# Patient Record
Sex: Male | Born: 1975 | Race: Black or African American | Hispanic: No | Smoking: Current some day smoker
Health system: Southern US, Community
[De-identification: ages and names within clinical notes are randomized; demographics above are authoritative.]

## PROBLEM LIST (undated history)

## (undated) DIAGNOSIS — E789 Disorder of lipoprotein metabolism, unspecified: Secondary | ICD-10-CM

## (undated) DIAGNOSIS — Z87442 Personal history of urinary calculi: Secondary | ICD-10-CM

## (undated) DIAGNOSIS — S065XAA Traumatic subdural hemorrhage with loss of consciousness status unknown, initial encounter: Secondary | ICD-10-CM

## (undated) DIAGNOSIS — K219 Gastro-esophageal reflux disease without esophagitis: Secondary | ICD-10-CM

## (undated) DIAGNOSIS — G4730 Sleep apnea, unspecified: Secondary | ICD-10-CM

## (undated) DIAGNOSIS — S060XAA Concussion with loss of consciousness status unknown, initial encounter: Secondary | ICD-10-CM

## (undated) HISTORY — DX: Sleep apnea, unspecified: G47.30

## (undated) HISTORY — DX: Concussion with loss of consciousness status unknown, initial encounter: S06.0XAA

## (undated) HISTORY — DX: Gastro-esophageal reflux disease without esophagitis: K21.9

## (undated) HISTORY — DX: Disorder of lipoprotein metabolism, unspecified: E78.9

## (undated) HISTORY — DX: Traumatic subdural hemorrhage with loss of consciousness status unknown, initial encounter: S06.5XAA

---

## 1999-12-28 ENCOUNTER — Emergency Department (HOSPITAL_COMMUNITY): Admission: EM | Admit: 1999-12-28 | Discharge: 1999-12-28 | Payer: Self-pay | Admitting: Emergency Medicine

## 1999-12-30 ENCOUNTER — Emergency Department (HOSPITAL_COMMUNITY): Admission: EM | Admit: 1999-12-30 | Discharge: 1999-12-30 | Payer: Self-pay | Admitting: Emergency Medicine

## 2001-01-23 ENCOUNTER — Emergency Department (HOSPITAL_COMMUNITY): Admission: EM | Admit: 2001-01-23 | Discharge: 2001-01-23 | Payer: Self-pay | Admitting: Emergency Medicine

## 2002-08-17 ENCOUNTER — Emergency Department (HOSPITAL_COMMUNITY): Admission: EM | Admit: 2002-08-17 | Discharge: 2002-08-17 | Payer: Self-pay | Admitting: Emergency Medicine

## 2002-08-17 ENCOUNTER — Encounter: Payer: Self-pay | Admitting: Emergency Medicine

## 2002-09-14 ENCOUNTER — Encounter: Admission: RE | Admit: 2002-09-14 | Discharge: 2002-12-13 | Payer: Self-pay | Admitting: Orthopedic Surgery

## 2002-10-31 ENCOUNTER — Emergency Department (HOSPITAL_COMMUNITY): Admission: EM | Admit: 2002-10-31 | Discharge: 2002-10-31 | Payer: Self-pay

## 2002-11-02 ENCOUNTER — Emergency Department (HOSPITAL_COMMUNITY): Admission: EM | Admit: 2002-11-02 | Discharge: 2002-11-02 | Payer: Self-pay | Admitting: Emergency Medicine

## 2004-11-05 ENCOUNTER — Emergency Department (HOSPITAL_COMMUNITY): Admission: EM | Admit: 2004-11-05 | Discharge: 2004-11-05 | Payer: Self-pay | Admitting: Emergency Medicine

## 2005-06-06 ENCOUNTER — Emergency Department (HOSPITAL_COMMUNITY): Admission: EM | Admit: 2005-06-06 | Discharge: 2005-06-06 | Payer: Self-pay | Admitting: Emergency Medicine

## 2006-05-12 ENCOUNTER — Emergency Department (HOSPITAL_COMMUNITY): Admission: EM | Admit: 2006-05-12 | Discharge: 2006-05-13 | Payer: Self-pay | Admitting: Emergency Medicine

## 2006-06-14 ENCOUNTER — Emergency Department (HOSPITAL_COMMUNITY): Admission: EM | Admit: 2006-06-14 | Discharge: 2006-06-15 | Payer: Self-pay | Admitting: Emergency Medicine

## 2006-06-18 ENCOUNTER — Emergency Department (HOSPITAL_COMMUNITY): Admission: EM | Admit: 2006-06-18 | Discharge: 2006-06-18 | Payer: Self-pay | Admitting: Emergency Medicine

## 2007-01-05 ENCOUNTER — Emergency Department (HOSPITAL_COMMUNITY): Admission: EM | Admit: 2007-01-05 | Discharge: 2007-01-05 | Payer: Self-pay | Admitting: Emergency Medicine

## 2007-10-19 ENCOUNTER — Emergency Department (HOSPITAL_COMMUNITY): Admission: EM | Admit: 2007-10-19 | Discharge: 2007-10-19 | Payer: Self-pay | Admitting: Emergency Medicine

## 2008-06-06 ENCOUNTER — Emergency Department (HOSPITAL_COMMUNITY): Admission: EM | Admit: 2008-06-06 | Discharge: 2008-06-06 | Payer: Self-pay | Admitting: Emergency Medicine

## 2008-09-09 ENCOUNTER — Emergency Department (HOSPITAL_COMMUNITY): Admission: EM | Admit: 2008-09-09 | Discharge: 2008-09-09 | Payer: Self-pay | Admitting: Emergency Medicine

## 2008-10-29 ENCOUNTER — Emergency Department (HOSPITAL_COMMUNITY): Admission: EM | Admit: 2008-10-29 | Discharge: 2008-10-29 | Payer: Self-pay | Admitting: Emergency Medicine

## 2010-05-30 ENCOUNTER — Emergency Department (HOSPITAL_BASED_OUTPATIENT_CLINIC_OR_DEPARTMENT_OTHER)
Admission: EM | Admit: 2010-05-30 | Discharge: 2010-05-30 | Disposition: A | Discharge status: Home / Self Care | Payer: Self-pay | Attending: Emergency Medicine | Admitting: Emergency Medicine

## 2010-05-30 DIAGNOSIS — Z202 Contact with and (suspected) exposure to infections with a predominantly sexual mode of transmission: Secondary | ICD-10-CM | POA: Insufficient documentation

## 2010-05-30 DIAGNOSIS — F17200 Nicotine dependence, unspecified, uncomplicated: Secondary | ICD-10-CM | POA: Insufficient documentation

## 2010-07-20 LAB — RPR: RPR Ser Ql: NONREACTIVE

## 2010-07-20 LAB — GC/CHLAMYDIA PROBE AMP, GENITAL: Chlamydia, DNA Probe: POSITIVE — AB

## 2010-07-29 LAB — RPR: RPR Ser Ql: NONREACTIVE

## 2010-07-29 LAB — URINALYSIS, ROUTINE W REFLEX MICROSCOPIC
Hgb urine dipstick: NEGATIVE
Ketones, ur: NEGATIVE mg/dL
Protein, ur: NEGATIVE mg/dL
Urobilinogen, UA: 1 mg/dL (ref 0.0–1.0)

## 2010-07-29 LAB — GC/CHLAMYDIA PROBE AMP, GENITAL: Chlamydia, DNA Probe: NEGATIVE

## 2011-01-22 LAB — URINALYSIS, ROUTINE W REFLEX MICROSCOPIC
Nitrite: NEGATIVE
Specific Gravity, Urine: 1.019
Urobilinogen, UA: 1

## 2012-10-16 ENCOUNTER — Emergency Department (HOSPITAL_BASED_OUTPATIENT_CLINIC_OR_DEPARTMENT_OTHER)
Admission: EM | Admit: 2012-10-16 | Discharge: 2012-10-16 | Disposition: A | Discharge status: Home / Self Care | Payer: Self-pay | Attending: Emergency Medicine | Admitting: Emergency Medicine

## 2012-10-16 ENCOUNTER — Encounter (HOSPITAL_BASED_OUTPATIENT_CLINIC_OR_DEPARTMENT_OTHER): Payer: Self-pay | Admitting: *Deleted

## 2012-10-16 DIAGNOSIS — N509 Disorder of male genital organs, unspecified: Secondary | ICD-10-CM | POA: Insufficient documentation

## 2012-10-16 DIAGNOSIS — N451 Epididymitis: Secondary | ICD-10-CM

## 2012-10-16 DIAGNOSIS — R599 Enlarged lymph nodes, unspecified: Secondary | ICD-10-CM | POA: Insufficient documentation

## 2012-10-16 DIAGNOSIS — N5089 Other specified disorders of the male genital organs: Secondary | ICD-10-CM | POA: Insufficient documentation

## 2012-10-16 DIAGNOSIS — R509 Fever, unspecified: Secondary | ICD-10-CM | POA: Insufficient documentation

## 2012-10-16 DIAGNOSIS — F17200 Nicotine dependence, unspecified, uncomplicated: Secondary | ICD-10-CM | POA: Insufficient documentation

## 2012-10-16 DIAGNOSIS — N453 Epididymo-orchitis: Secondary | ICD-10-CM | POA: Insufficient documentation

## 2012-10-16 LAB — URINALYSIS, ROUTINE W REFLEX MICROSCOPIC
Hgb urine dipstick: NEGATIVE
Ketones, ur: 15 mg/dL — AB
Protein, ur: 30 mg/dL — AB
Urobilinogen, UA: 2 mg/dL — ABNORMAL HIGH (ref 0.0–1.0)

## 2012-10-16 LAB — URINE MICROSCOPIC-ADD ON

## 2012-10-16 MED ORDER — CEFTRIAXONE SODIUM 1 G IJ SOLR
1.0000 g | Freq: Once | INTRAMUSCULAR | Status: AC
  Administered 2012-10-16: 1 g via INTRAMUSCULAR
  Filled 2012-10-16: qty 10

## 2012-10-16 MED ORDER — DOXYCYCLINE HYCLATE 50 MG PO CAPS: 50.0000 mg | ORAL_CAPSULE | Freq: Two times a day (BID) | ORAL | Status: DC

## 2012-10-16 MED ORDER — HYDROCODONE-ACETAMINOPHEN 5-325 MG PO TABS: 1.0000 | ORAL_TABLET | Freq: Four times a day (QID) | ORAL | Status: DC | PRN

## 2012-10-16 MED ORDER — IBUPROFEN 600 MG PO TABS: 600.0000 mg | ORAL_TABLET | Freq: Four times a day (QID) | ORAL | Status: DC | PRN

## 2012-10-16 NOTE — ED Notes (Signed)
Patient having swelling in his testicles and states that he is having chills. Started Thursday. States lower abd pain. Multiple sexual partners and had trichomonas in the past. Denies burning with urination and denies discharge.

## 2012-10-16 NOTE — ED Notes (Signed)
No reaction noted at injection site.

## 2012-10-16 NOTE — ED Provider Notes (Signed)
I saw and evaluated the patient, reviewed the resident's note and I agree with the findings and plan.   .Face to face Exam:  General:  Awake HEENT:  Atraumatic Resp:  Normal effort Abd:  Nondistended Neuro:No focal weakness   Nelia Shi, MD 10/16/12 1347

## 2012-10-16 NOTE — ED Provider Notes (Signed)
History    CSN: [id number] Arrival date & time 10/16/12  1104  First MD Initiated Contact with Patient 10/16/12 1134     Chief Complaint  Patient presents with  . Groin Swelling   (Consider location/radiation/quality/duration/timing/severity/associated sxs/prior Treatment) HPI Comments: Pt is a 37yo male who presents with 2-3 days of left testicular swelling and pain, gradually worsening, now with fever for ~24 hours. Pt has a history of trichomonas in the past, but no other STI's. Pt's fiance in the room, present by verbal consent from patient. Pt reports multiple sexual partners, most recent new partner about 5-6 days ago. Pt states he started having left testicular swelling about 3 days ago, with worsening pain over the last 48 hours. Pain is suprapubic "right above his penis" and in the testicle itself, and some in the perineal region where his thigh meets his trunk. Mild redness of the overlying scrotum. No pain/swelling/tenderness in the right testicle. No history of GC/Chlamydia to his knowledge.  Otherwise denies symptoms or other medical problems. Pt takes no medications regularly. No chills, N/V, chest pain, SOB, leg swelling/pain. No history of hernia, testicular torsion.  The history is provided by the patient and a significant other (fiance). No language interpreter was used.   History reviewed. No pertinent past medical history. History reviewed. No pertinent past surgical history. No family history on file. History  Substance Use Topics  . Smoking status: Current Every Day Smoker  . Smokeless tobacco: Not on file  . Alcohol Use: No    Review of Systems  Constitutional: Positive for fever. Negative for chills, activity change and appetite change.  HENT: Negative for congestion, sore throat, rhinorrhea, neck pain and neck stiffness.   Respiratory: Negative for shortness of breath and wheezing.   Cardiovascular: Negative for chest pain and leg swelling.   Gastrointestinal: Positive for abdominal pain. Negative for nausea, vomiting, diarrhea, constipation and rectal pain.  Genitourinary: Positive for scrotal swelling and testicular pain. Negative for dysuria, urgency, flank pain, decreased urine volume, discharge and difficulty urinating.  Musculoskeletal: Negative for joint swelling.  Skin: Negative for rash and wound.  Neurological: Negative for syncope, weakness and numbness.    Allergies  Review of patient's allergies indicates no known allergies.  Home Medications   Current Outpatient Rx  Name  Route  Sig  Dispense  Refill  . doxycycline (VIBRAMYCIN) 50 MG capsule   Oral   Take 1 capsule (50 mg total) by mouth 2 (two) times daily.   14 capsule   0   . HYDROcodone-acetaminophen (NORCO) 5-325 MG per tablet   Oral   Take 1 tablet by mouth every 6 (six) hours as needed for pain.   30 tablet   0   . ibuprofen (ADVIL,MOTRIN) 600 MG tablet   Oral   Take 1 tablet (600 mg total) by mouth every 6 (six) hours as needed for pain.   50 tablet   0    BP 138/82  Pulse 92  Temp(Src) 100.8 F (38.2 C) (Oral)  Resp 16  Ht 6' (1.829 m)  Wt 185 lb (83.915 kg)  BMI 25.08 kg/m2  SpO2 100% Physical Exam  Nursing note and vitals reviewed. Constitutional: He is oriented to person, place, and time. He appears well-developed and well-nourished.  HENT:  Head: Normocephalic and atraumatic.  Eyes: Conjunctivae are normal. Pupils are equal, round, and reactive to light.  Neck: Normal range of motion. Neck supple.  Cardiovascular: Normal rate, regular rhythm and normal heart sounds.  No murmur heard. Pulmonary/Chest: Effort normal and breath sounds normal. No respiratory distress. He has no wheezes.  Abdominal: Soft. Bowel sounds are normal. He exhibits no distension. There is tenderness.  Low suprapubic, immediately superior to base of shaft of penis  Genitourinary: Penis normal. Right testis shows no mass, no swelling and no tenderness.  Left testis shows swelling and tenderness. Left testis shows no mass. Circumcised. No penile tenderness.  Left testicle itself is swollen and frankly tender, no masses appreciated but left half of scrotum is enlarged, tender, and edematous  Musculoskeletal: Normal range of motion. He exhibits no edema and no tenderness.  Lymphadenopathy:       Right: Inguinal adenopathy present.       Left: Inguinal adenopathy present.  Neurological: He is alert and oriented to person, place, and time. No cranial nerve deficit.  Skin: Skin is warm and dry. No rash noted. There is erythema.  Mild erythema to left half of scrotum over swollen testicle  Psychiatric: He has a normal mood and affect. His behavior is normal.    ED Course  Procedures (including critical care time)  1205: Exam as above, UA with findings immediately below. Urine culture collected as well as urine GC/Chlamydia probe. No mention of trichomonas on urine microscopy. Consider [place] for torsion, though infection seems more likely, epididymitis vs orchitis.  1315: Dr. Radford Pax discussed with Dr. Wilson Singer (urology). Recommendation for empiric treatment for epididymitis, Rocephin IM x1 here plus doxycycline BID for 7 days, then follow-up with urology if no improvement, as an outpt. Dr. Wilson Singer did not recommend ultrasound.   Labs Reviewed  URINALYSIS, ROUTINE W REFLEX MICROSCOPIC - Abnormal; Notable for the following:    Color, Urine AMBER (*)    APPearance CLOUDY (*)    Specific Gravity, Urine 1.031 (*)    Bilirubin Urine SMALL (*)    Ketones, ur 15 (*)    Protein, ur 30 (*)    Urobilinogen, UA 2.0 (*)    Leukocytes, UA SMALL (*)    All other components within normal limits  URINE MICROSCOPIC-ADD ON - Abnormal; Notable for the following:    Bacteria, UA FEW (*)    All other components within normal limits  URINE CULTURE  CHLAMYDIA TRACHOMATIS, PROBE AMP  NEISSERIA GONORRHOEAE, PROBE AMP   No results found. 1. Epididymitis, left     MDM   37yo male with left testicular swelling/pain/redness consistent with epididymitis. Will give Rocephin x1 here plus doxy for 7 days. F/u with urology as needed as an outpt. Red flags that would prompt immediate return to the ED reviewed.  The above was discussed in its entirety with attending ED physician Dr. Radford Pax.   Bobbye Morton, MD  PGY-2, [facility]  Bobbye Morton, MD 10/16/12 [phone]

## 2012-10-18 LAB — URINE CULTURE
Colony Count: NO GROWTH
Culture: NO GROWTH

## 2012-10-18 LAB — CHLAMYDIA TRACHOMATIS, PROBE AMP: CT Probe RNA: NEGATIVE

## 2013-11-08 ENCOUNTER — Emergency Department (HOSPITAL_COMMUNITY)
Admission: EM | Admit: 2013-11-08 | Discharge: 2013-11-08 | Disposition: A | Discharge status: Home / Self Care | Payer: No Typology Code available for payment source | Attending: Emergency Medicine | Admitting: Emergency Medicine

## 2013-11-08 ENCOUNTER — Emergency Department (HOSPITAL_COMMUNITY): Payer: No Typology Code available for payment source

## 2013-11-08 ENCOUNTER — Encounter (HOSPITAL_COMMUNITY): Payer: Self-pay | Admitting: Emergency Medicine

## 2013-11-08 DIAGNOSIS — S46909A Unspecified injury of unspecified muscle, fascia and tendon at shoulder and upper arm level, unspecified arm, initial encounter: Secondary | ICD-10-CM | POA: Insufficient documentation

## 2013-11-08 DIAGNOSIS — S46812A Strain of other muscles, fascia and tendons at shoulder and upper arm level, left arm, initial encounter: Secondary | ICD-10-CM

## 2013-11-08 DIAGNOSIS — Y9389 Activity, other specified: Secondary | ICD-10-CM | POA: Insufficient documentation

## 2013-11-08 DIAGNOSIS — S4980XA Other specified injuries of shoulder and upper arm, unspecified arm, initial encounter: Secondary | ICD-10-CM | POA: Insufficient documentation

## 2013-11-08 DIAGNOSIS — S199XXA Unspecified injury of neck, initial encounter: Secondary | ICD-10-CM

## 2013-11-08 DIAGNOSIS — S0990XA Unspecified injury of head, initial encounter: Secondary | ICD-10-CM | POA: Insufficient documentation

## 2013-11-08 DIAGNOSIS — Z79899 Other long term (current) drug therapy: Secondary | ICD-10-CM | POA: Insufficient documentation

## 2013-11-08 DIAGNOSIS — S0993XA Unspecified injury of face, initial encounter: Secondary | ICD-10-CM | POA: Insufficient documentation

## 2013-11-08 DIAGNOSIS — S46819A Strain of other muscles, fascia and tendons at shoulder and upper arm level, unspecified arm, initial encounter: Principal | ICD-10-CM

## 2013-11-08 DIAGNOSIS — F17200 Nicotine dependence, unspecified, uncomplicated: Secondary | ICD-10-CM | POA: Insufficient documentation

## 2013-11-08 DIAGNOSIS — Y92410 Unspecified street and highway as the place of occurrence of the external cause: Secondary | ICD-10-CM | POA: Insufficient documentation

## 2013-11-08 DIAGNOSIS — IMO0002 Reserved for concepts with insufficient information to code with codable children: Secondary | ICD-10-CM | POA: Insufficient documentation

## 2013-11-08 DIAGNOSIS — S43499A Other sprain of unspecified shoulder joint, initial encounter: Secondary | ICD-10-CM | POA: Insufficient documentation

## 2013-11-08 DIAGNOSIS — S29012A Strain of muscle and tendon of back wall of thorax, initial encounter: Secondary | ICD-10-CM

## 2013-11-08 MED ORDER — METHOCARBAMOL 500 MG PO TABS: 500.0000 mg | ORAL_TABLET | Freq: Two times a day (BID) | ORAL | Status: DC

## 2013-11-08 MED ORDER — DIAZEPAM 5 MG PO TABS
5.0000 mg | ORAL_TABLET | Freq: Once | ORAL | Status: AC
  Administered 2013-11-08: 5 mg via ORAL
  Filled 2013-11-08: qty 1

## 2013-11-08 MED ORDER — HYDROCODONE-ACETAMINOPHEN 5-325 MG PO TABS
2.0000 | ORAL_TABLET | Freq: Once | ORAL | Status: AC
  Administered 2013-11-08: 2 via ORAL
  Filled 2013-11-08: qty 2

## 2013-11-08 MED ORDER — OXYCODONE-ACETAMINOPHEN 5-325 MG PO TABS
1.0000 | ORAL_TABLET | Freq: Four times a day (QID) | ORAL | Status: DC | PRN
Start: 2013-11-08 — End: 2014-10-08

## 2013-11-08 NOTE — Discharge Instructions (Signed)
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
## 2013-11-10 NOTE — ED Provider Notes (Signed)
Medical screening examination/treatment/procedure(s) were performed by non-physician practitioner and as supervising physician I was immediately available for consultation/collaboration.   EKG Interpretation None       Olita Takeshita, MD 11/10/13 2212 

## 2013-11-11 ENCOUNTER — Emergency Department (HOSPITAL_COMMUNITY)
Admission: EM | Admit: 2013-11-11 | Discharge: 2013-11-11 | Disposition: A | Discharge status: Home / Self Care | Payer: No Typology Code available for payment source | Attending: Emergency Medicine | Admitting: Emergency Medicine

## 2013-11-11 ENCOUNTER — Encounter (HOSPITAL_COMMUNITY): Payer: Self-pay | Admitting: Emergency Medicine

## 2013-11-11 DIAGNOSIS — Z79899 Other long term (current) drug therapy: Secondary | ICD-10-CM | POA: Insufficient documentation

## 2013-11-11 DIAGNOSIS — F17200 Nicotine dependence, unspecified, uncomplicated: Secondary | ICD-10-CM | POA: Insufficient documentation

## 2013-11-11 DIAGNOSIS — M62838 Other muscle spasm: Secondary | ICD-10-CM | POA: Insufficient documentation

## 2013-11-11 DIAGNOSIS — IMO0001 Reserved for inherently not codable concepts without codable children: Secondary | ICD-10-CM | POA: Insufficient documentation

## 2013-11-11 DIAGNOSIS — M549 Dorsalgia, unspecified: Secondary | ICD-10-CM | POA: Insufficient documentation

## 2013-11-11 DIAGNOSIS — M79604 Pain in right leg: Secondary | ICD-10-CM

## 2013-11-11 DIAGNOSIS — M79609 Pain in unspecified limb: Secondary | ICD-10-CM | POA: Insufficient documentation

## 2013-11-11 DIAGNOSIS — M25569 Pain in unspecified knee: Secondary | ICD-10-CM | POA: Insufficient documentation

## 2013-11-11 DIAGNOSIS — M542 Cervicalgia: Secondary | ICD-10-CM | POA: Insufficient documentation

## 2013-11-11 MED ORDER — NAPROXEN 500 MG PO TABS: 500.0000 mg | ORAL_TABLET | Freq: Two times a day (BID) | ORAL | Status: DC | PRN

## 2013-11-11 MED ORDER — METHOCARBAMOL 500 MG PO TABS: 500.0000 mg | ORAL_TABLET | Freq: Three times a day (TID) | ORAL | Status: DC | PRN

## 2013-11-11 MED ORDER — OXYCODONE-ACETAMINOPHEN 5-325 MG PO TABS: 1.0000 | ORAL_TABLET | Freq: Four times a day (QID) | ORAL | Status: DC | PRN
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
## 2013-11-11 NOTE — Discharge Instructions (Signed)
Your pain is related to muscle inflammation and spasm. You will need to use Naprosyn in addition to the Robaxin you've been given already to try to help with inflammation and pain, as well as spasms. Take as directed, and don't drive while taking Robaxin or Percocet since they cause drowsiness. Use heat over the affected areas, 20 minutes at a time, every hour. Call the orthopedic doctor for an appointment in 2-3 weeks, that can be cancelled if your symptoms improve. Expect to be sore for the next 1-2 weeks. Return to the emergency department for any changes or worsening symptoms.   Muscle Cramps and Spasms Muscle cramps and spasms are when muscles tighten by themselves. They usually get better within minutes. Muscle cramps are painful. They are usually stronger and last longer than muscle spasms. Muscle spasms may or may not be painful. They can last a few seconds or much longer. HOME CARE  Drink enough fluid to keep your pee (urine) clear or pale yellow.  Massage, stretch, and relax the muscle.  Use a warm towel, heating pad, or warm shower water on tight muscles.  Place ice on the muscle if it is tender or in pain.  Put ice in a plastic bag.  Place a towel between your skin and the bag.  Leave the ice on for 15-20 minutes, 03-04 times a day.  Only take medicine as told by your doctor. GET HELP RIGHT AWAY IF:  Your cramps or spasms get worse, happen more often, or do not get better with time. MAKE SURE YOU:  Understand these instructions.  Will watch your condition.  Will get help right away if you are not doing well or get worse. Document Released: 03/12/2008 Document Revised: 07/25/2012 Document Reviewed: 03/16/2012 [facility] Patient Information 2015 [place], [place]. This information is not intended to replace advice given to you by your health care provider. Make sure you discuss any questions you have with your health care provider.  Musculoskeletal Pain Musculoskeletal pain  is muscle and boney aches and pains. These pains can occur in any part of the body. Your caregiver may treat you without knowing the cause of the pain. They may treat you if blood or urine tests, X-rays, and other tests were normal.  CAUSES There is often not a definite cause or reason for these pains. These pains may be caused by a type of germ (virus). The discomfort may also come from overuse. Overuse includes working out too hard when your body is not fit. Boney aches also come from weather changes. Bone is sensitive to atmospheric pressure changes. HOME CARE INSTRUCTIONS   Ask when your test results will be ready. Make sure you get your test results.  Only take over-the-counter or prescription medicines for pain, discomfort, or fever as directed by your caregiver. If you were given medications for your condition, do not drive, operate machinery or power tools, or sign legal documents for 24 hours. Do not drink alcohol. Do not take sleeping pills or other medications that may interfere with treatment.  Continue all activities unless the activities cause more pain. When the pain lessens, slowly resume normal activities. Gradually increase the intensity and duration of the activities or exercise.  During periods of severe pain, bed rest may be helpful. Lay or sit in any position that is comfortable.  Putting ice on the injured area.  Put ice in a bag.  Place a towel between your skin and the bag.  Leave the ice on for 15 to  20 minutes, 3 to 4 times a day.  Follow up with your caregiver for continued problems and no reason can be found for the pain. If the pain becomes worse or does not go away, it may be necessary to repeat tests or do additional testing. Your caregiver may need to look further for a possible cause. SEEK IMMEDIATE MEDICAL CARE IF:  You have pain that is getting worse and is not relieved by medications.  You develop chest pain that is associated with shortness or breath,  sweating, feeling sick to your stomach (nauseous), or throw up (vomit).  Your pain becomes localized to the abdomen.  You develop any new symptoms that seem different or that concern you. MAKE SURE YOU:   Understand these instructions.  Will watch your condition.  Will get help right away if you are not doing well or get worse. Document Released: 03/30/2005 Document Revised: 06/22/2011 Document Reviewed: 12/02/2012 [facility] Patient Information 2015 [place], [place]. This information is not intended to replace advice given to you by your health care provider. Make sure you discuss any questions you have with your health care provider.  Heat Therapy Heat therapy can help make painful, stiff muscles and joints feel better. Do not use heat on new injuries. Wait at least 48 hours after an injury to use heat. Do not use heat when you have aches or pains right after an activity. If you still have pain 3 hours after stopping the activity, then you may use heat. HOME CARE Wet heat pack  Soak a clean towel in warm water. Squeeze out the extra water.  Put the warm, wet towel in a plastic bag.  Place a thin, dry towel between your skin and the bag.  Put the heat pack on the area for 5 minutes, and check your skin. Your skin may be pink, but it should not be red.  Leave the heat pack on the area for 15 to 30 minutes.  Repeat this every 2 to 4 hours while awake. Do not use heat while you are sleeping. Warm water bath  Fill a tub with warm water.  Place the affected body part in the tub.  Soak the area for 20 to 40 minutes.  Repeat as needed. Hot water bottle  Fill the water bottle half full with hot water.  Press out the extra air. Close the cap tightly.  Place a dry towel between your skin and the bottle.  Put the bottle on the area for 5 minutes, and check your skin. Your skin may be pink, but it should not be red.  Leave the bottle on the area for 15 to 30 minutes.  Repeat  this every 2 to 4 hours while awake. Electric heating pad  Place a dry towel between your skin and the heating pad.  Set the heating pad on low heat.  Put the heating pad on the area for 10 minutes, and check your skin. Your skin may be pink, but it should not be red.  Leave the heating pad on the area for 20 to 40 minutes.  Repeat this every 2 to 4 hours while awake.  Do not lie on the heating pad.  Do not fall asleep while using the heating pad.  Do not use the heating pad near water. GET HELP RIGHT AWAY IF:  You get blisters or red skin.  Your skin is puffy (swollen), or you lose feeling (numbness) in the affected area.  You have any new problems.  Your problems are getting worse.  You have any questions or concerns. If you have any problems, stop using heat therapy until you see your doctor. MAKE SURE YOU:  Understand these instructions.  Will watch your condition.  Will get help right away if you are not doing well or get worse. Document Released: 06/22/2011 Document Reviewed: 05/23/2013 [facility] Patient Information 2015 [place]. This information is not intended to replace advice given to you by your health care provider. Make sure you discuss any questions you have with your health care provider.
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
## 2013-11-11 NOTE — ED Provider Notes (Signed)
Medical screening examination/treatment/procedure(s) were performed by non-physician practitioner and as supervising physician I was immediately available for consultation/collaboration.   EKG Interpretation None        Rhylen Pulido S Lekia Nier, MD 11/11/13 1539 

## 2014-10-08 ENCOUNTER — Emergency Department (HOSPITAL_BASED_OUTPATIENT_CLINIC_OR_DEPARTMENT_OTHER)
Admission: EM | Admit: 2014-10-08 | Discharge: 2014-10-08 | Disposition: A | Discharge status: Home / Self Care | Payer: Self-pay | Attending: Emergency Medicine | Admitting: Emergency Medicine

## 2014-10-08 ENCOUNTER — Encounter (HOSPITAL_BASED_OUTPATIENT_CLINIC_OR_DEPARTMENT_OTHER): Payer: Self-pay | Admitting: *Deleted

## 2014-10-08 DIAGNOSIS — M25511 Pain in right shoulder: Secondary | ICD-10-CM

## 2014-10-08 DIAGNOSIS — Y92410 Unspecified street and highway as the place of occurrence of the external cause: Secondary | ICD-10-CM | POA: Insufficient documentation

## 2014-10-08 DIAGNOSIS — Z720 Tobacco use: Secondary | ICD-10-CM | POA: Insufficient documentation

## 2014-10-08 DIAGNOSIS — S4991XA Unspecified injury of right shoulder and upper arm, initial encounter: Secondary | ICD-10-CM | POA: Insufficient documentation

## 2014-10-08 DIAGNOSIS — Y998 Other external cause status: Secondary | ICD-10-CM | POA: Insufficient documentation

## 2014-10-08 DIAGNOSIS — S3992XA Unspecified injury of lower back, initial encounter: Secondary | ICD-10-CM | POA: Insufficient documentation

## 2014-10-08 DIAGNOSIS — M545 Low back pain: Secondary | ICD-10-CM

## 2014-10-08 DIAGNOSIS — Y9389 Activity, other specified: Secondary | ICD-10-CM | POA: Insufficient documentation

## 2014-10-08 MED ORDER — CYCLOBENZAPRINE HCL 10 MG PO TABS: 10.0000 mg | ORAL_TABLET | Freq: Three times a day (TID) | ORAL | Status: DC | PRN

## 2014-10-08 NOTE — Discharge Instructions (Signed)
No driving or operating heavy machinery while taking flexeril. This medication may make you drowsy. Take ibuprofen or naproxen as needed for pain. Rest, avoid heavy lifting or hard physical activity for the next 2 days. Apply heat intermittently to your back and ice to your shoulder. Lumbosacral Strain Lumbosacral strain is a strain of any of the parts that make up your lumbosacral vertebrae. Your lumbosacral vertebrae are the bones that make up the lower third of your backbone. Your lumbosacral vertebrae are held together by muscles and tough, fibrous tissue (ligaments).  CAUSES  A sudden blow to your back can cause lumbosacral strain. Also, anything that causes an excessive stretch of the muscles in the low back can cause this strain. This is typically seen when people exert themselves strenuously, fall, lift heavy objects, bend, or crouch repeatedly. RISK FACTORS  Physically demanding work.  Participation in pushing or pulling sports or sports that require a sudden twist of the back (tennis, golf, baseball).  Weight lifting.  Excessive lower back curvature.  Forward-tilted pelvis.  Weak back or abdominal muscles or both.  Tight hamstrings. SIGNS AND SYMPTOMS  Lumbosacral strain may cause pain in the area of your injury or pain that moves (radiates) down your leg.  DIAGNOSIS Your health care provider can often diagnose lumbosacral strain through a physical exam. In some cases, you may need tests such as X-ray exams.  TREATMENT  Treatment for your lower back injury depends on many factors that your clinician will have to evaluate. However, most treatment will include the use of anti-inflammatory medicines. HOME CARE INSTRUCTIONS   Avoid hard physical activities (tennis, racquetball, waterskiing) if you are not in proper physical condition for it. This may aggravate or create problems.  If you have a back problem, avoid sports requiring sudden body movements. Swimming and walking are  generally safer activities.  Maintain good posture.  Maintain a healthy weight.  For acute conditions, you may put ice on the injured area.  Put ice in a plastic bag.  Place a towel between your skin and the bag.  Leave the ice on for 20 minutes, 2-3 times a day.  When the low back starts healing, stretching and strengthening exercises may be recommended. SEEK MEDICAL CARE IF:  Your back pain is getting worse.  You experience severe back pain not relieved with medicines. SEEK IMMEDIATE MEDICAL CARE IF:   You have numbness, tingling, weakness, or problems with the use of your arms or legs.  There is a change in bowel or bladder control.  You have increasing pain in any area of the body, including your belly (abdomen).  You notice shortness of breath, dizziness, or feel faint.  You feel sick to your stomach (nauseous), are throwing up (vomiting), or become sweaty.  You notice discoloration of your toes or legs, or your feet get very cold. MAKE SURE YOU:   Understand these instructions.  Will watch your condition.  Will get help right away if you are not doing well or get worse. Document Released: 01/07/2005 Document Revised: 04/04/2013 Document Reviewed: 11/16/2012 [facility] Patient Information 2015 [place], [place]. This information is not intended to replace advice given to you by your health care provider. Make sure you discuss any questions you have with your health care provider.  Shoulder Pain The shoulder is the joint that connects your arms to your body. The bones that form the shoulder joint include the upper arm bone (humerus), the shoulder blade (scapula), and the collarbone (clavicle). The top of  the humerus is shaped like a ball and fits into a rather flat socket on the scapula (glenoid cavity). A combination of muscles and strong, fibrous tissues that connect muscles to bones (tendons) support your shoulder joint and hold the ball in the socket. Small,  fluid-filled sacs (bursae) are located in different areas of the joint. They act as cushions between the bones and the overlying soft tissues and help reduce friction between the gliding tendons and the bone as you move your arm. Your shoulder joint allows a wide range of motion in your arm. This range of motion allows you to do things like scratch your back or throw a ball. However, this range of motion also makes your shoulder more prone to pain from overuse and injury. Causes of shoulder pain can originate from both injury and overuse and usually can be grouped in the following four categories:  Redness, swelling, and pain (inflammation) of the tendon (tendinitis) or the bursae (bursitis).  Instability, such as a dislocation of the joint.  Inflammation of the joint (arthritis).  Broken bone (fracture). HOME CARE INSTRUCTIONS   Apply ice to the sore area.  Put ice in a plastic bag.  Place a towel between your skin and the bag.  Leave the ice on for 15-20 minutes, 3-4 times per day for the first 2 days, or as directed by your health care provider.  Stop using cold packs if they do not help with the pain.  If you have a shoulder sling or immobilizer, wear it as long as your caregiver instructs. Only remove it to shower or bathe. Move your arm as little as possible, but keep your hand moving to prevent swelling.  Squeeze a soft ball or foam pad as much as possible to help prevent swelling.  Only take over-the-counter or prescription medicines for pain, discomfort, or fever as directed by your caregiver. SEEK MEDICAL CARE IF:   Your shoulder pain increases, or new pain develops in your arm, hand, or fingers.  Your hand or fingers become cold and numb.  Your pain is not relieved with medicines. SEEK IMMEDIATE MEDICAL CARE IF:   Your arm, hand, or fingers are numb or tingling.  Your arm, hand, or fingers are significantly swollen or turn white or blue. MAKE SURE YOU:   Understand  these instructions.  Will watch your condition.  Will get help right away if you are not doing well or get worse. Document Released: 01/07/2005 Document Revised: 08/14/2013 Document Reviewed: 03/14/2011 [facility] Patient Information 2015 [place], [place]. This information is not intended to replace advice given to you by your health care provider. Make sure you discuss any questions you have with your health care provider.  Motor Vehicle Collision It is common to have multiple bruises and sore muscles after a motor vehicle collision (MVC). These tend to feel worse for the first 24 hours. You may have the most stiffness and soreness over the first several hours. You may also feel worse when you wake up the first morning after your collision. After this point, you will usually begin to improve with each day. The speed of improvement often depends on the severity of the collision, the number of injuries, and the location and nature of these injuries. HOME CARE INSTRUCTIONS  Put ice on the injured area.  Put ice in a plastic bag.  Place a towel between your skin and the bag.  Leave the ice on for 15-20 minutes, 3-4 times a day, or as directed by  your health care provider.  Drink enough fluids to keep your urine clear or pale yellow. Do not drink alcohol.  Take a warm shower or bath once or twice a day. This will increase blood flow to sore muscles.  You may return to activities as directed by your caregiver. Be careful when lifting, as this may aggravate neck or back pain.  Only take over-the-counter or prescription medicines for pain, discomfort, or fever as directed by your caregiver. Do not use aspirin. This may increase bruising and bleeding. SEEK IMMEDIATE MEDICAL CARE IF:  You have numbness, tingling, or weakness in the arms or legs.  You develop severe headaches not relieved with medicine.  You have severe neck pain, especially tenderness in the middle of the back of your  neck.  You have changes in bowel or bladder control.  There is increasing pain in any area of the body.  You have shortness of breath, light-headedness, dizziness, or fainting.  You have chest pain.  You feel sick to your stomach (nauseous), throw up (vomit), or sweat.  You have increasing abdominal discomfort.  There is blood in your urine, stool, or vomit.  You have pain in your shoulder (shoulder strap areas).  You feel your symptoms are getting worse. MAKE SURE YOU:  Understand these instructions.  Will watch your condition.  Will get help right away if you are not doing well or get worse. Document Released: 03/30/2005 Document Revised: 08/14/2013 Document Reviewed: 08/27/2010 [facility] Patient Information 2015 [place], [place]. This information is not intended to replace advice given to you by your health care provider. Make sure you discuss any questions you have with your health care provider.

## 2014-10-08 NOTE — ED Notes (Signed)
PA at bedside.
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
## 2016-04-19 ENCOUNTER — Emergency Department (HOSPITAL_COMMUNITY)
Admission: EM | Admit: 2016-04-19 | Discharge: 2016-04-19 | Disposition: A | Discharge status: Home / Self Care | Payer: Self-pay | Attending: Emergency Medicine | Admitting: Emergency Medicine

## 2016-04-19 ENCOUNTER — Encounter (HOSPITAL_COMMUNITY): Payer: Self-pay

## 2016-04-19 DIAGNOSIS — Z79899 Other long term (current) drug therapy: Secondary | ICD-10-CM | POA: Insufficient documentation

## 2016-04-19 DIAGNOSIS — L501 Idiopathic urticaria: Secondary | ICD-10-CM | POA: Insufficient documentation

## 2016-04-19 DIAGNOSIS — F17210 Nicotine dependence, cigarettes, uncomplicated: Secondary | ICD-10-CM | POA: Insufficient documentation

## 2016-04-19 DIAGNOSIS — R21 Rash and other nonspecific skin eruption: Secondary | ICD-10-CM

## 2016-04-19 MED ORDER — CLOTRIMAZOLE-BETAMETHASONE 1-0.05 % EX CREA: 1.0000 "application " | TOPICAL_CREAM | Freq: Two times a day (BID) | CUTANEOUS | 0 refills | Status: DC

## 2016-04-19 NOTE — ED Triage Notes (Signed)
Pt here with hives on back, abdomen and chest.  Started 3 days ago.  No new meds/detergents.

## 2016-04-19 NOTE — Discharge Instructions (Signed)
Use cream as directed to treat your rash, and continue your usual home medications. Get plenty of rest, drink plenty of fluids, shower with cool water (no hot steamy showers); use soap only on your "bits" (underarms and bottom); use cetaphil cleanser or dove soap when using soap; use eucerin or aquaphor lotion after every bath/shower. Please followup with your primary doctor for discussion of your diagnoses and further evaluation after today's visit in 1 week for recheck of symptoms. Return to the ER for changes or worsening symptoms

## 2016-04-19 NOTE — ED Provider Notes (Signed)
[facility]-EMERGENCY DEPT Provider Note   CSN: [id number] Arrival date & time: 04/19/16  1011 By signing my name below, I, Jerry Singh, attest that this documentation has been prepared under the direction and in the presence of non-physician practitioner, Rennie Hack Camprubi-Soms, PA-C. Electronically Signed: Levon HedgerElizabeth Singh, Scribe. 04/19/2016. 11:10 AM.   History   Chief Complaint Chief Complaint  Patient presents with  . Urticaria    HPI Jerry Singh is a 41 y.o. male who presents to the Emergency Department complaining of itching, scattered rash to his upper body and thighs onset 2-3 days ago. Pt used his daughter's Suave body wash before the rash began, but is unsure of when he used this compared to when the rash started. No new lotions, detergents, creams, meds, foods, animals, plants, or any other known triggers; no other affected individuals at home. He has applied generic antifungal (possibly lotrimin but not sure) with no relief. No alleviating or modifying factors noted. He states he does not workout at the gym and does not have a job where he sweats heavily. Pt denies any tongue or lip swelling, trouble swallowing, drooling, wheezing/trouble breathing, fevers, chills, CP, SOB, abdominal pain, N/V/D/C, hematuria, dysuria, myalgias, arthralgias, numbness, tingling, weakness, or any other complaints at this time.    The history is provided by the patient and medical records. No language interpreter was used.  Urticaria  This is a new problem. The current episode started more than 2 days ago. The problem occurs constantly. The problem has not changed since onset.Pertinent negatives include no chest pain, no abdominal pain and no shortness of breath. Nothing aggravates the symptoms. Nothing relieves the symptoms. Treatments tried: OTC fungal cream. The treatment provided no relief.    History reviewed. No pertinent past medical history.  There are no active problems to display for this  patient.   History reviewed. No pertinent surgical history.   Home Medications    Prior to Admission medications   Medication Sig Start Date End Date Taking? Authorizing Provider  cyclobenzaprine (FLEXERIL) 10 MG tablet Take 1 tablet (10 mg total) by mouth every 8 (eight) hours as needed for muscle spasms. 10/08/14   Kathrynn Speedobyn M Hess, PA-C    Family History History reviewed. No pertinent family history.  Social History Social History  Substance Use Topics  . Smoking status: Current Every Day Smoker    Packs/day: 0.50    Types: Cigarettes  . Smokeless tobacco: Never Used  . Alcohol use No     Allergies   Food   Review of Systems Review of Systems  Constitutional: Negative for chills and fever.  HENT: Negative for facial swelling and trouble swallowing.   Respiratory: Negative for shortness of breath.   Cardiovascular: Negative for chest pain.  Gastrointestinal: Negative for abdominal pain, constipation, diarrhea, nausea and vomiting.  Genitourinary: Negative for dysuria and hematuria.  Musculoskeletal: Negative for arthralgias and myalgias.  Skin: Positive for rash.  Allergic/Immunologic: Negative for immunocompromised state.  Neurological: Negative for weakness and numbness.  Psychiatric/Behavioral: Negative for confusion.  10 Systems reviewed and are negative for acute change except as noted in the HPI.   Physical Exam Updated Vital Signs BP 115/89 (BP Location: Right Arm)   Pulse 77   Temp 98.3 F (36.8 C) (Oral)   Resp 18   SpO2 100%   Physical Exam  Constitutional: He is oriented to person, place, and time. Vital signs are normal. He appears well-developed and well-nourished.  Non-toxic appearance. No distress.  Afebrile, nontoxic, NAD  HENT:  Head: Normocephalic and atraumatic.  Mouth/Throat: Mucous membranes are normal.  Eyes: Conjunctivae and EOM are normal. Right eye exhibits no discharge. Left eye exhibits no discharge.  Neck: Normal range of motion.  Neck supple.  Cardiovascular: Normal rate and intact distal pulses.   Pulmonary/Chest: Effort normal. No respiratory distress.  Abdominal: Normal appearance. He exhibits no distension.  Musculoskeletal: Normal range of motion.  Neurological: He is alert and oriented to person, place, and time. He has normal strength. No sensory deficit.  Skin: Skin is warm, dry and intact. Rash noted. Rash is maculopapular and urticarial.  Multiple circular raised patches of rash over the entire upper body and arms, somewhat dry and lichenified in some areas, with other areas appearing to have a circular scale around a central cleared area; rash mildly erythematous, but without warmth, no interdigital web space involvement and no burrowing.   Psychiatric: He has a normal mood and affect.  Nursing note and vitals reviewed.   ED Treatments / Results  DIAGNOSTIC STUDIES:  Oxygen Saturation is 100% on RA, normal by my interpretation.    COORDINATION OF CARE:  11:38 AM Discussed treatment plan with pt at bedside and pt agreed to plan.  Labs (all labs ordered are listed, but only abnormal results are displayed) Labs Reviewed - No data to display  EKG  EKG Interpretation None       Radiology No results found.  Procedures Procedures (including critical care time)  Medications Ordered in ED Medications - No data to display   Initial Impression / Assessment and Plan / ED Course  I have reviewed the triage vital signs and the nursing notes.  Pertinent labs & imaging results that were available during my care of the patient were reviewed by me and considered in my medical decision making (see chart for details).  Clinical Course     41 y.o. male here with rash x3 days, recently changed soap but isn't sure if this caused it. Some areas of rash look like it could be ringworm, but other areas just look like eczema vs psoriasis. Will treat with lotrisone which would help with both. Discussed  avoidance of triggers, and staying moisturized, etc for eczema care. F/up with PCP in 1wk. I explained the diagnosis and have given explicit precautions to return to the ER including for any other new or worsening symptoms. The patient understands and accepts the medical plan as it's been dictated and I have answered their questions. Discharge instructions concerning home care and prescriptions have been given. The patient is STABLE and is discharged to home in good condition.   I personally performed the services described in this documentation, which was scribed in my presence. The recorded information has been reviewed and is accurate.   Final Clinical Impressions(s) / ED Diagnoses   Final diagnoses:  Rash  Idiopathic urticaria    New Prescriptions New Prescriptions   CLOTRIMAZOLE-BETAMETHASONE (LOTRISONE) CREAM    Apply 1 application topically 2 (two) times daily.     Allan Bacigalupi Camprubi-Soms, PA-C 04/19/16 1215    Vanetta Mulders, MD 04/21/16 2121

## 2017-07-17 ENCOUNTER — Encounter (HOSPITAL_BASED_OUTPATIENT_CLINIC_OR_DEPARTMENT_OTHER): Payer: Self-pay | Admitting: Emergency Medicine

## 2017-07-17 ENCOUNTER — Emergency Department (HOSPITAL_BASED_OUTPATIENT_CLINIC_OR_DEPARTMENT_OTHER): Payer: No Typology Code available for payment source

## 2017-07-17 ENCOUNTER — Emergency Department (HOSPITAL_BASED_OUTPATIENT_CLINIC_OR_DEPARTMENT_OTHER)
Admission: EM | Admit: 2017-07-17 | Discharge: 2017-07-17 | Disposition: A | Discharge status: Home / Self Care | Payer: No Typology Code available for payment source | Attending: Emergency Medicine | Admitting: Emergency Medicine

## 2017-07-17 ENCOUNTER — Other Ambulatory Visit: Payer: Self-pay

## 2017-07-17 DIAGNOSIS — M542 Cervicalgia: Secondary | ICD-10-CM | POA: Insufficient documentation

## 2017-07-17 DIAGNOSIS — Y9389 Activity, other specified: Secondary | ICD-10-CM | POA: Insufficient documentation

## 2017-07-17 DIAGNOSIS — M25511 Pain in right shoulder: Secondary | ICD-10-CM | POA: Diagnosis not present

## 2017-07-17 DIAGNOSIS — Z79899 Other long term (current) drug therapy: Secondary | ICD-10-CM | POA: Diagnosis not present

## 2017-07-17 DIAGNOSIS — Y92410 Unspecified street and highway as the place of occurrence of the external cause: Secondary | ICD-10-CM | POA: Insufficient documentation

## 2017-07-17 DIAGNOSIS — M5410 Radiculopathy, site unspecified: Secondary | ICD-10-CM | POA: Insufficient documentation

## 2017-07-17 DIAGNOSIS — Y999 Unspecified external cause status: Secondary | ICD-10-CM | POA: Insufficient documentation

## 2017-07-17 DIAGNOSIS — F17210 Nicotine dependence, cigarettes, uncomplicated: Secondary | ICD-10-CM | POA: Insufficient documentation

## 2017-07-17 MED ORDER — CYCLOBENZAPRINE HCL 10 MG PO TABS: 10.0000 mg | ORAL_TABLET | Freq: Two times a day (BID) | ORAL | 0 refills | Status: DC | PRN

## 2017-07-17 MED ORDER — METHYLPREDNISOLONE 4 MG PO TBPK: ORAL_TABLET | ORAL | 0 refills | Status: DC

## 2017-07-17 MED ORDER — HYDROCODONE-ACETAMINOPHEN 5-325 MG PO TABS: 1.0000 | ORAL_TABLET | ORAL | 0 refills | Status: DC | PRN
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
## 2017-07-17 NOTE — ED Notes (Signed)
ED Provider at bedside. 

## 2017-07-17 NOTE — ED Notes (Signed)
Patient transported to X-ray 

## 2017-07-17 NOTE — Discharge Instructions (Signed)
Your workup today showed no evidence of fracture or dislocation on your spine, chest, or shoulder.  You did have some degenerative disc disease seen on your cervical spine.  As you are symptoms are intermittent both with pain and with the tingling, we did not feel he needed emergent MRI however, your pain does sound radicular and nerve like.  Please use the steroid pack as well as the pain and muscle relaxant to help with your symptoms.  Please follow-up with your primary doctor in several days.  If any symptoms change, worsen, or persist, please return to the nearest emergency department to consider MRI of the neck.

## 2017-07-24 ENCOUNTER — Emergency Department (HOSPITAL_BASED_OUTPATIENT_CLINIC_OR_DEPARTMENT_OTHER)
Admission: EM | Admit: 2017-07-24 | Discharge: 2017-07-24 | Disposition: A | Discharge status: Home / Self Care | Payer: No Typology Code available for payment source | Attending: Emergency Medicine | Admitting: Emergency Medicine

## 2017-07-24 ENCOUNTER — Emergency Department (HOSPITAL_BASED_OUTPATIENT_CLINIC_OR_DEPARTMENT_OTHER): Payer: No Typology Code available for payment source

## 2017-07-24 ENCOUNTER — Encounter (HOSPITAL_BASED_OUTPATIENT_CLINIC_OR_DEPARTMENT_OTHER): Payer: Self-pay | Admitting: Emergency Medicine

## 2017-07-24 ENCOUNTER — Other Ambulatory Visit: Payer: Self-pay

## 2017-07-24 DIAGNOSIS — M5412 Radiculopathy, cervical region: Secondary | ICD-10-CM | POA: Diagnosis not present

## 2017-07-24 DIAGNOSIS — M542 Cervicalgia: Secondary | ICD-10-CM | POA: Diagnosis present

## 2017-07-24 DIAGNOSIS — F17210 Nicotine dependence, cigarettes, uncomplicated: Secondary | ICD-10-CM | POA: Insufficient documentation

## 2017-07-24 MED ORDER — METHOCARBAMOL 500 MG PO TABS: 500.0000 mg | ORAL_TABLET | Freq: Two times a day (BID) | ORAL | 0 refills | Status: DC

## 2017-07-24 MED ORDER — IBUPROFEN 800 MG PO TABS: 800.0000 mg | ORAL_TABLET | Freq: Three times a day (TID) | ORAL | 0 refills | Status: DC | PRN

## 2017-07-24 NOTE — Discharge Instructions (Signed)
You were seen in the ED today with neck pain. Your MRI showed pinching of the nerves coming from your neck. I am changing some of your medications and will have you call the Neurosurgery office listed on Monday to schedule a follow up appointment with them to discuss management options.   Return to the ED with any sudden weakness, numbness, or worsening pain.
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
## 2018-12-20 ENCOUNTER — Other Ambulatory Visit: Payer: Self-pay

## 2018-12-20 DIAGNOSIS — Z20822 Contact with and (suspected) exposure to covid-19: Secondary | ICD-10-CM

## 2018-12-22 LAB — NOVEL CORONAVIRUS, NAA: SARS-CoV-2, NAA: NOT DETECTED

## 2021-04-13 HISTORY — PX: INGUINAL HERNIA REPAIR: SUR1180

## 2021-09-18 ENCOUNTER — Encounter (HOSPITAL_BASED_OUTPATIENT_CLINIC_OR_DEPARTMENT_OTHER): Payer: Self-pay | Admitting: Family Medicine

## 2021-09-18 ENCOUNTER — Ambulatory Visit (INDEPENDENT_AMBULATORY_CARE_PROVIDER_SITE_OTHER): Payer: Managed Care, Other (non HMO)

## 2021-09-18 ENCOUNTER — Ambulatory Visit (INDEPENDENT_AMBULATORY_CARE_PROVIDER_SITE_OTHER): Payer: Managed Care, Other (non HMO) | Admitting: Family Medicine

## 2021-09-18 VITALS — BP 115/83 | HR 68 | Ht 71.0 in | Wt 184.6 lb

## 2021-09-18 DIAGNOSIS — G8929 Other chronic pain: Secondary | ICD-10-CM | POA: Diagnosis not present

## 2021-09-18 DIAGNOSIS — M25561 Pain in right knee: Secondary | ICD-10-CM

## 2021-09-18 DIAGNOSIS — M25511 Pain in right shoulder: Secondary | ICD-10-CM | POA: Diagnosis not present

## 2021-09-18 DIAGNOSIS — M25562 Pain in left knee: Secondary | ICD-10-CM
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
## 2021-09-24 ENCOUNTER — Encounter (HOSPITAL_BASED_OUTPATIENT_CLINIC_OR_DEPARTMENT_OTHER): Payer: Self-pay

## 2021-09-29 DIAGNOSIS — G8929 Other chronic pain: Secondary | ICD-10-CM | POA: Insufficient documentation
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
## 2021-10-22 ENCOUNTER — Encounter (HOSPITAL_BASED_OUTPATIENT_CLINIC_OR_DEPARTMENT_OTHER): Payer: Self-pay | Admitting: Physical Therapy

## 2021-10-22 ENCOUNTER — Ambulatory Visit (HOSPITAL_BASED_OUTPATIENT_CLINIC_OR_DEPARTMENT_OTHER): Payer: Managed Care, Other (non HMO) | Attending: Family Medicine | Admitting: Physical Therapy

## 2021-10-22 DIAGNOSIS — M25511 Pain in right shoulder: Secondary | ICD-10-CM | POA: Insufficient documentation

## 2021-10-22 DIAGNOSIS — G8929 Other chronic pain: Secondary | ICD-10-CM | POA: Diagnosis not present

## 2021-10-22 DIAGNOSIS — M25611 Stiffness of right shoulder, not elsewhere classified: Secondary | ICD-10-CM | POA: Diagnosis not present

## 2021-10-22 DIAGNOSIS — M25561 Pain in right knee: Secondary | ICD-10-CM | POA: Diagnosis not present

## 2021-10-22 DIAGNOSIS — M25562 Pain in left knee: Secondary | ICD-10-CM | POA: Insufficient documentation

## 2021-10-22 DIAGNOSIS — M62838 Other muscle spasm: Secondary | ICD-10-CM | POA: Insufficient documentation

## 2021-10-22 NOTE — Therapy (Signed)
OUTPATIENT PHYSICAL THERAPY SHOULDER EVALUATION   Patient Name: Jerry Singh MRN: [id number] DOB:03/08/1976, 46 y.o., male Today's Date: 10/22/2021   PT End of Session - 10/22/21 1005     Visit Number 1    Number of Visits 12    Date for PT Re-Evaluation 12/03/21    PT Start Time 0857   Patient 13 minutes late   PT Stop Time 0930    PT Time Calculation (min) 33 min    Activity Tolerance Patient tolerated treatment well    Behavior During Therapy [facility] for tasks assessed/performed             History reviewed. No pertinent past medical history. History reviewed. No pertinent surgical history. Patient Active Problem List   Diagnosis Date Noted   Chronic right shoulder pain 09/29/2021   Bilateral chronic knee pain 09/29/2021    PCP: Patient, No Pcp Per  REFERRING PROVIDER: de [place], Raymond J, MD  REFERRING DIAG:  [phone] (ICD-10-CM) - Chronic right shoulder pain  M25.561,M25.562,G89.29 (ICD-10-CM) - Bilateral chronic knee pain    THERAPY DIAG:  Acute pain of right shoulder  Other muscle spasm  Stiffness of right shoulder, not elsewhere classified  Rationale for Evaluation and Treatment Rehabilitation  ONSET DATE: 8 months ago - shoulder  SUBJECTIVE:                                                                                                                                                                                      SUBJECTIVE STATEMENT: Patient reports he was playing basketball 8 months ago and was hit by another player in the R shoulder while shooting. He had an immediate onset of pain on the posterior aspect - he describes it felt like "hitting his funny bone". He has had issues moving his shoulder since - primarily in overhead movements and behind the back. He has been unable to play basketball since. He reports some occasional tingling in the area with overhead movements; denies any sx or changes in sensation in the UE. He also has some  chronic bil knee pain - says it is arthritis but doesn't bother him except after playing golf or basketball for multiple hours. He wants to improve motion and strength in the shoulder to return to full function with hobbies.  PERTINENT HISTORY: No significant PMH.  PAIN:  Are you having pain? Yes: NPRS scale: 8/10 Pain location: Posterior medial to shoulder, over upper trap/supraspinatus Pain description: Ache, soreness Aggravating factors: Overhead motion, behind the back or head, shooting Relieving factors: Rest  PRECAUTIONS: None  WEIGHT BEARING RESTRICTIONS No  FALLS:  Has patient fallen in last 6 months? No  LIVING  ENVIRONMENT: Lives with: lives alone Lives in: House/apartment  OCCUPATION: Retired  PLOF: Independent  PATIENT GOALS    Return to basketball, golf without pain.  OBJECTIVE:   DIAGNOSTIC FINDINGS:  X-ray 09/18/21 IMPRESSION: No acute osseous abnormality identified.    PATIENT SURVEYS:  FOTO 60 74 expected in 10 visits COGNITION:  Overall cognitive status: Within functional limits for tasks assessed     SENSATION: WFL  POSTURE: WNL  UPPER EXTREMITY ROM:   Active ROM Right eval Left eval  Shoulder flexion 125 Full   Shoulder extension    Shoulder abduction 125 Full   Shoulder adduction    Shoulder internal rotation Painful to L2  T-10 on the left   Shoulder external rotation    Elbow flexion    Elbow extension    Wrist flexion    Wrist extension    Wrist ulnar deviation    Wrist radial deviation    Wrist pronation    Wrist supination    (Blank rows = not tested)  UPPER EXTREMITY MMT:  MMT Right eval Left eval  Shoulder flexion 44.1 50.2  Shoulder extension    Shoulder abduction 56 47  Shoulder adduction    Shoulder internal rotation 38.2 39.2  Shoulder external rotation 20.5 painful 36.1  Middle trapezius    Lower trapezius    Elbow flexion    Elbow extension    Wrist flexion    Wrist extension    Wrist ulnar  deviation    Wrist radial deviation    Wrist pronation    Wrist supination    Grip strength (lbs)    (Blank rows = not tested)  SHOULDER SPECIAL TESTS:  Rotator cuff assessment: Drop arm test: negative, Empty can test: negative, Full can test: negative, and Gerber lift off test: negative  JOINT MOBILITY TESTING:  WNL and pain-free with AP and Inf mobs  PALPATION:  Tender to palpation over R supraspinatus/upper trap.   TODAY'S TREATMENT:  Eval  Seated shoulder flexion w/red tb Seated bil ER w/red tb Standing row w/red tb   Education on self-STM   PATIENT EDUCATION: Education details: Therapeutic plan of care, HEP, symptom management Person educated: Patient Education method: Explanation Education comprehension: verbalized understanding   HOME EXERCISE PROGRAM: Access Code: [email or web address] URL: [email or web address] Date: 10/22/2021 Prepared by: Lorayne Bender   ASSESSMENT:  CLINICAL IMPRESSION: Patient is a 46 y.o. M who was seen today for physical therapy evaluation and treatment for R shoulder and bil knee pain. Pt was approx 15 mins late and reports his knees rarely bother him except after long durations of exercise - focused today's evaluation on shoulder pain. He primary limitation is pain and stiffness of posterior periscapular musculature that limits him in overhead and rotational movements. He has been very active and wants to return to basketball and golf without difficulty or worry of pain. He will benefit from skilled therapy to improve range of motion, increase strength, and restore function to return him to prior level with activities.   OBJECTIVE IMPAIRMENTS decreased ROM, decreased strength, increased muscle spasms, and impaired UE functional use.   ACTIVITY LIMITATIONS dressing and reach over head  PARTICIPATION LIMITATIONS: driving and community activity  PERSONAL FACTORS Age, Fitness, and Time since onset of injury/illness/exacerbation are  also affecting patient's functional outcome.   REHAB POTENTIAL: Good  CLINICAL DECISION MAKING: Evolving/moderate complexity  EVALUATION COMPLEXITY: Moderate   GOALS: Goals reviewed with patient? Yes  SHORT TERM GOALS: Target date: 11/12/2021   Patient will demonstrate full  and painless R shoulder flexion AROM. Baseline: Goal status: INITIAL  2.  Patient will decrease pain at worst from 8/10 to 5/10 NPRS to meet MCID. Baseline:  Goal status: INITIAL  3.  Patient will be able to reach behind head and behind back to T11 without pain to demonstrate improved ER/IR ROM. Baseline:  Goal status: INITIAL   LONG TERM GOALS: Target date: 12/03/2021    Patient will be able to shoot basketball with normal form and no pain. Baseline:  Goal status: INITIAL  2.  Patient will be independent with gym and exercise routine to maintain current level of strength and fitness. Baseline:  Goal status: INITIAL  Additional goals for bil knee to be made as indicated following further evaluation.   PLAN: PT FREQUENCY: 2x/week  PT DURATION: 6 weeks  PLANNED INTERVENTIONS: Therapeutic exercises, Therapeutic activity, Neuromuscular re-education, Balance training, Gait training, Patient/Family education, Joint mobilization, Dry Needling, Cryotherapy, Moist heat, Ionotophoresis 4mg /ml Dexamethasone, and Manual therapy  PLAN FOR NEXT SESSION: Shoulder PROM/AAROM/AROM as tolerated to improve ROM. Periscap strength and stability exercises.    , Student-PT  During this treatment session, the therapist was present, participating in and directing the treatment.  Loura Halt, PT 10/22/2021, 3:45 PM

## 2021-10-31 ENCOUNTER — Encounter (HOSPITAL_BASED_OUTPATIENT_CLINIC_OR_DEPARTMENT_OTHER): Payer: Self-pay | Admitting: Physical Therapy

## 2021-10-31 ENCOUNTER — Ambulatory Visit (HOSPITAL_BASED_OUTPATIENT_CLINIC_OR_DEPARTMENT_OTHER): Payer: Managed Care, Other (non HMO) | Admitting: Physical Therapy

## 2021-10-31 DIAGNOSIS — M25511 Pain in right shoulder: Secondary | ICD-10-CM | POA: Diagnosis not present

## 2021-10-31 DIAGNOSIS — M62838 Other muscle spasm: Secondary | ICD-10-CM

## 2021-10-31 DIAGNOSIS — M25611 Stiffness of right shoulder, not elsewhere classified: Secondary | ICD-10-CM

## 2021-10-31 NOTE — Therapy (Signed)
OUTPATIENT PHYSICAL THERAPY SHOULDER EVALUATION   Patient Name: Jerry Singh MRN: [id number] DOB:14-May-1975, 46 y.o., male Today's Date: 10/31/2021     No past medical history on file. No past surgical history on file. Patient Active Problem List   Diagnosis Date Noted   Chronic right shoulder pain 09/29/2021   Bilateral chronic knee pain 09/29/2021    PCP: Patient, No Pcp Per  REFERRING PROVIDER: de [place], Raymond J, MD  REFERRING DIAG:  [phone] (ICD-10-CM) - Chronic right shoulder pain  M25.561,M25.562,G89.29 (ICD-10-CM) - Bilateral chronic knee pain    THERAPY DIAG:  No diagnosis found.  Rationale for Evaluation and Treatment Rehabilitation  ONSET DATE: 8 months ago - shoulder  SUBJECTIVE:                                                                                                                                                                                      SUBJECTIVE STATEMENT: The patient reports it has been abut the same. It dosen't hurt until he reaches overhead.  PERTINENT HISTORY: No significant PMH.  PAIN:  Are you having pain? Yes: NPRS scale: 8/10 Pain location: Posterior medial to shoulder, over upper trap/supraspinatus Pain description: Ache, soreness Aggravating factors: Overhead motion, behind the back or head, shooting Relieving factors: Rest  PRECAUTIONS: None  WEIGHT BEARING RESTRICTIONS No  FALLS:  Has patient fallen in last 6 months? No  LIVING ENVIRONMENT: Lives with: lives alone Lives in: House/apartment  OCCUPATION: Retired  PLOF: Independent  PATIENT GOALS    Return to basketball, golf without pain.  OBJECTIVE:   DIAGNOSTIC FINDINGS:  X-ray 09/18/21 IMPRESSION: No acute osseous abnormality identified.    PATIENT SURVEYS:  FOTO 60 74 expected in 10 visits COGNITION:  Overall cognitive status: Within functional limits for tasks assessed     SENSATION: WFL  POSTURE: WNL  UPPER EXTREMITY ROM:    Active ROM Right eval Left eval  Shoulder flexion 125 Full   Shoulder extension    Shoulder abduction 125 Full   Shoulder adduction    Shoulder internal rotation Painful to L2  T-10 on the left   Shoulder external rotation    Elbow flexion    Elbow extension    Wrist flexion    Wrist extension    Wrist ulnar deviation    Wrist radial deviation    Wrist pronation    Wrist supination    (Blank rows = not tested)  UPPER EXTREMITY MMT:  MMT Right eval Left eval  Shoulder flexion 44.1 50.2  Shoulder extension    Shoulder abduction 56 47  Shoulder adduction    Shoulder internal rotation 38.2 39.2  Shoulder external rotation 20.5  painful 36.1  Middle trapezius    Lower trapezius    Elbow flexion    Elbow extension    Wrist flexion    Wrist extension    Wrist ulnar deviation    Wrist radial deviation    Wrist pronation    Wrist supination    Grip strength (lbs)    (Blank rows = not tested)  SHOULDER SPECIAL TESTS:  Rotator cuff assessment: Drop arm test: negative, Empty can test: negative, Full can test: negative, and Gerber lift off test: negative  JOINT MOBILITY TESTING:  WNL and pain-free with AP and Inf mobs  PALPATION:  Tender to palpation over R supraspinatus/upper trap.   TODAY'S TREATMENT:  7/21 Extension red 2x15  Serratus punches 3lbs 2x15 red  Bilateral ER Red 2x15   Manual: trigger point release to upper trap and levator   Trigger Point Dry-Needling  Treatment instructions: Expect mild to moderate muscle soreness. S/S of pneumothorax if dry needled over a lung field, and to seek immediate medical attention should they occur. Patient verbalized understanding of these instructions and education.  Patient Consent Given: Yes Education handout provided: Yes Muscles treated: upper trap 2 spots using a .30x50 needle  Electrical stimulation performed: No Parameters: N/A Treatment response/outcome: good response to needling      Eval  Seated  shoulder flexion w/red tb Seated bil ER w/red tb Standing row w/red tb   Education on self-STM   PATIENT EDUCATION: Education details: Therapeutic plan of care, HEP, symptom management Person educated: Patient Education method: Explanation Education comprehension: verbalized understanding   HOME EXERCISE PROGRAM: Access Code: [email or web address] URL: [email or web address] Date: 10/22/2021 Prepared by: Lorayne Bender   ASSESSMENT:  CLINICAL IMPRESSION: The patient had a great twitch with needling. Only 2 needles performed because the second one caused several twitches. The patient had a significant trigger point with manual therapy. He tolerated there-ex well. Therapy will continue to progress as tolerated.    OBJECTIVE IMPAIRMENTS decreased ROM, decreased strength, increased muscle spasms, and impaired UE functional use.   ACTIVITY LIMITATIONS dressing and reach over head  PARTICIPATION LIMITATIONS: driving and community activity  PERSONAL FACTORS Age, Fitness, and Time since onset of injury/illness/exacerbation are also affecting patient's functional outcome.   REHAB POTENTIAL: Good  CLINICAL DECISION MAKING: Evolving/moderate complexity  EVALUATION COMPLEXITY: Moderate   GOALS: Goals reviewed with patient? Yes  SHORT TERM GOALS: Target date: 11/12/2021   Patient will demonstrate full and painless R shoulder flexion AROM. Baseline: Goal status: INITIAL  2.  Patient will decrease pain at worst from 8/10 to 5/10 NPRS to meet MCID. Baseline:  Goal status: INITIAL  3.  Patient will be able to reach behind head and behind back to T11 without pain to demonstrate improved ER/IR ROM. Baseline:  Goal status: INITIAL   LONG TERM GOALS: Target date: 12/03/2021    Patient will be able to shoot basketball with normal form and no pain. Baseline:  Goal status: INITIAL  2.  Patient will be independent with gym and exercise routine to maintain current level of strength  and fitness. Baseline:  Goal status: INITIAL  Additional goals for bil knee to be made as indicated following further evaluation.   PLAN: PT FREQUENCY: 2x/week  PT DURATION: 6 weeks  PLANNED INTERVENTIONS: Therapeutic exercises, Therapeutic activity, Neuromuscular re-education, Balance training, Gait training, Patient/Family education, Joint mobilization, Dry Needling, Cryotherapy, Moist heat, Ionotophoresis 4mg /ml Dexamethasone, and Manual therapy  PLAN FOR NEXT SESSION: Shoulder PROM/AAROM/AROM as tolerated to improve ROM. Periscap strength and  stability exercises.    Loura Halt, Student-PT  During this treatment session, the therapist was present, participating in and directing the treatment.  Dessie Coma, PT 10/31/2021, 3:56 PM

## 2021-11-11 ENCOUNTER — Ambulatory Visit (HOSPITAL_BASED_OUTPATIENT_CLINIC_OR_DEPARTMENT_OTHER): Payer: Managed Care, Other (non HMO) | Attending: Family Medicine | Admitting: Physical Therapy

## 2021-11-11 DIAGNOSIS — M25611 Stiffness of right shoulder, not elsewhere classified: Secondary | ICD-10-CM | POA: Insufficient documentation

## 2021-11-11 DIAGNOSIS — M25511 Pain in right shoulder: Secondary | ICD-10-CM | POA: Insufficient documentation

## 2021-11-11 DIAGNOSIS — M62838 Other muscle spasm: Secondary | ICD-10-CM | POA: Diagnosis present

## 2021-11-11 NOTE — Therapy (Addendum)
OUTPATIENT PHYSICAL THERAPY SHOULDER EVALUATION/dischrge    Patient Name: Jerry Singh MRN: [id number] DOB:08/18/1975, 46 y.o., male Today's Date: 11/12/2021   PT End of Session - 11/12/21 0629     Visit Number 3    Number of Visits 12    Date for PT Re-Evaluation 12/03/21    PT Start Time 0930    PT Stop Time 1012    PT Time Calculation (min) 42 min    Activity Tolerance Patient tolerated treatment well    Behavior During Therapy [facility] for tasks assessed/performed              History reviewed. No pertinent past medical history. History reviewed. No pertinent surgical history. Patient Active Problem List   Diagnosis Date Noted   Chronic right shoulder pain 09/29/2021   Bilateral chronic knee pain 09/29/2021    PCP: Patient, No Pcp Per  REFERRING PROVIDER: de [place], Raymond J, MD  REFERRING DIAG:  [phone] (ICD-10-CM) - Chronic right shoulder pain  M25.561,M25.562,G89.29 (ICD-10-CM) - Bilateral chronic knee pain    THERAPY DIAG:  Acute pain of right shoulder  Other muscle spasm  Stiffness of right shoulder, not elsewhere classified  Rationale for Evaluation and Treatment Rehabilitation  ONSET DATE: 8 months ago - shoulder  SUBJECTIVE:                                                                                                                                                                                      SUBJECTIVE STATEMENT: The patient reports it has been abut the same. It dosen't hurt until he reaches overhead.  PERTINENT HISTORY: No significant PMH.  PAIN:  Are you having pain? Yes: NPRS scale: 8/10 Pain location: Posterior medial to shoulder, over upper trap/supraspinatus Pain description: Ache, soreness Aggravating factors: Overhead motion, behind the back or head, shooting Relieving factors: Rest  PRECAUTIONS: None  WEIGHT BEARING RESTRICTIONS No  FALLS:  Has patient fallen in last 6 months? No  LIVING ENVIRONMENT: Lives  with: lives alone Lives in: House/apartment  OCCUPATION: Retired  PLOF: Independent  PATIENT GOALS    Return to basketball, golf without pain.  OBJECTIVE:   DIAGNOSTIC FINDINGS:  X-ray 09/18/21 IMPRESSION: No acute osseous abnormality identified.    PATIENT SURVEYS:  FOTO 60 74 expected in 10 visits COGNITION:  Overall cognitive status: Within functional limits for tasks assessed     SENSATION: WFL  POSTURE: WNL  UPPER EXTREMITY ROM:   Active ROM Right eval Left eval  Shoulder flexion 125 Full   Shoulder extension    Shoulder abduction 125 Full   Shoulder adduction    Shoulder internal rotation Painful to L2  T-10 on  the left   Shoulder external rotation    Elbow flexion    Elbow extension    Wrist flexion    Wrist extension    Wrist ulnar deviation    Wrist radial deviation    Wrist pronation    Wrist supination    (Blank rows = not tested)  UPPER EXTREMITY MMT:  MMT Right eval Left eval  Shoulder flexion 44.1 50.2  Shoulder extension    Shoulder abduction 56 47  Shoulder adduction    Shoulder internal rotation 38.2 39.2  Shoulder external rotation 20.5 painful 36.1  Middle trapezius    Lower trapezius    Elbow flexion    Elbow extension    Wrist flexion    Wrist extension    Wrist ulnar deviation    Wrist radial deviation    Wrist pronation    Wrist supination    Grip strength (lbs)    (Blank rows = not tested)  SHOULDER SPECIAL TESTS:  Rotator cuff assessment: Drop arm test: negative, Empty can test: negative, Full can test: negative, and Gerber lift off test: negative  JOINT MOBILITY TESTING:  WNL and pain-free with AP and Inf mobs  PALPATION:  Tender to palpation over R supraspinatus/upper trap.   TODAY'S TREATMENT:  8/1 Supine ABC 3 lbs 2x  SL ER 2x15 3 lbs   Trigger Point Dry-Needling  Treatment instructions: Expect mild to moderate muscle soreness. S/S of pneumothorax if dry needled over a lung field, and to seek  immediate medical attention should they occur. Patient verbalized understanding of these instructions and education.  Patient Consent Given: Yes Education handout provided: Yes Muscles treated: upper trap 2 spots using a .30x50 needle  Electrical stimulation performed: No Parameters: N/A Treatment response/outcome: good response to needling    T-band green: Ext 2x15 Row 2x15  ER 2x15 IR 2x15   7/21 Extension red 2x15  Serratus punches 3lbs 2x15 red  Bilateral ER Red 2x15   Manual: trigger point release to upper trap and levator   Trigger Point Dry-Needling  Treatment instructions: Expect mild to moderate muscle soreness. S/S of pneumothorax if dry needled over a lung field, and to seek immediate medical attention should they occur. Patient verbalized understanding of these instructions and education.  Patient Consent Given: Yes Education handout provided: Yes Muscles treated: upper trap 2 spots using a .30x50 needle  Electrical stimulation performed: No Parameters: N/A Treatment response/outcome: good response to needling      Eval  Seated shoulder flexion w/red tb Seated bil ER w/red tb Standing row w/red tb   Education on self-STM   PATIENT EDUCATION: Education details: Therapeutic plan of care, HEP, symptom management Person educated: Patient Education method: Explanation Education comprehension: verbalized understanding   HOME EXERCISE PROGRAM: Access Code: [email or web address] URL: [email or web address] Date: 10/22/2021 Prepared by: Carolyne Littles   ASSESSMENT:  CLINICAL IMPRESSION: The patient continues to have a great twitch with dry needling. We needled 3 spots today in his upper trap. All three spots twitched well. We advanced his exercises. He had mild discomfort with his supine ABC's. We advanced his band scapular exercises and added supine ABC and side lying ER to his program. Therapy will continue to progress as tolerated. He is somewhat  frustrated by his lack of progress but he has only had 2 visits.   OBJECTIVE IMPAIRMENTS decreased ROM, decreased strength, increased muscle spasms, and impaired UE functional use.   ACTIVITY LIMITATIONS dressing and reach over head  PARTICIPATION LIMITATIONS: driving and community activity  PERSONAL FACTORS Age, Fitness, and Time since onset of injury/illness/exacerbation are also affecting patient's functional outcome.   REHAB POTENTIAL: Good  CLINICAL DECISION MAKING: Evolving/moderate complexity  EVALUATION COMPLEXITY: Moderate   GOALS: Goals reviewed with patient? Yes  SHORT TERM GOALS: Target date: 11/12/2021   Patient will demonstrate full and painless R shoulder flexion AROM. Baseline: Goal status: INITIAL  2.  Patient will decrease pain at worst from 8/10 to 5/10 NPRS to meet MCID. Baseline:  Goal status: INITIAL  3.  Patient will be able to reach behind head and behind back to T11 without pain to demonstrate improved ER/IR ROM. Baseline:  Goal status: INITIAL   LONG TERM GOALS: Target date: 12/03/2021    Patient will be able to shoot basketball with normal form and no pain. Baseline:  Goal status: INITIAL  2.  Patient will be independent with gym and exercise routine to maintain current level of strength and fitness. Baseline:  Goal status: INITIAL  Additional goals for bil knee to be made as indicated following further evaluation.   PLAN: PT FREQUENCY: 2x/week  PT DURATION: 6 weeks  PLANNED INTERVENTIONS: Therapeutic exercises, Therapeutic activity, Neuromuscular re-education, Balance training, Gait training, Patient/Family education, Joint mobilization, Dry Needling, Cryotherapy, Moist heat, Ionotophoresis 4mg /ml Dexamethasone, and Manual therapy  PLAN FOR NEXT SESSION: Shoulder PROM/AAROM/AROM as tolerated to improve ROM. Periscap strength and stability exercises.  .PHYSICAL THERAPY DISCHARGE SUMMARY  Visits from Start of Care: 5  Current  functional level related to goals / functional outcomes: No improvement in shoulder pain    Remaining deficits: Went back to the MD    Education / Equipment: HEP   Patient agrees to discharge. Patient goals were not met. Patient is being discharged due to lack of progress.   Carney Living, PT 11/12/2021, 10:26 AM

## 2021-11-12 ENCOUNTER — Encounter (HOSPITAL_BASED_OUTPATIENT_CLINIC_OR_DEPARTMENT_OTHER): Payer: Self-pay | Admitting: Physical Therapy

## 2021-11-13 ENCOUNTER — Ambulatory Visit (INDEPENDENT_AMBULATORY_CARE_PROVIDER_SITE_OTHER): Payer: Managed Care, Other (non HMO) | Admitting: Family Medicine

## 2021-11-13 ENCOUNTER — Encounter (HOSPITAL_BASED_OUTPATIENT_CLINIC_OR_DEPARTMENT_OTHER): Payer: Self-pay | Admitting: Family Medicine

## 2021-11-13 VITALS — BP 109/81 | HR 80 | Temp 98.0°F | Ht 71.0 in | Wt 178.6 lb

## 2021-11-13 DIAGNOSIS — M25511 Pain in right shoulder: Secondary | ICD-10-CM | POA: Diagnosis not present

## 2021-11-13 DIAGNOSIS — G8929 Other chronic pain: Secondary | ICD-10-CM

## 2021-11-13 NOTE — Assessment & Plan Note (Addendum)
Patient presents for follow-up of right shoulder pain.  Since last visit he has been working with physical therapy, doing home exercises.  Despite conservative measures, physical therapy, patient continues to have right shoulder pain.  Pain is most notable when getting out of car and using the right arm to push off of the seat.  He also has pain/weakness when he goes to throw a ball such as a golf ball overhand.  Pain primarily along superior aspect of scapula, through trapezius, extending into right shoulder. On exam today, does have some tenderness through periscapular muscles, superior/medial margin of scapula.  Normal passive range of motion, slight reduction with overhead extension.  Empty can negative.  Hawkins with some pain at movement, negative Neer's. Discussed options, given continued symptoms despite conservative measures, physical therapy, we will proceed with MR imaging of the right shoulder for further evaluation.  We will discuss any further treatment recommendations once MRI imaging obtained

## 2021-11-13 NOTE — Progress Notes (Signed)
    Procedures performed today:    None.  Independent interpretation of notes and tests performed by another provider:   None.  Brief History, Exam, Impression, and Recommendations:    BP 109/81   Pulse 80   Temp 98 F (36.7 C)   Ht 5\' 11"  (1.803 m)   Wt 178 lb 9.6 oz (81 kg)   SpO2 100%   BMI 24.91 kg/m   Chronic right shoulder pain Patient presents for follow-up of right shoulder pain.  Since last visit he has been working with physical therapy, doing home exercises.  Despite conservative measures, physical therapy, patient continues to have right shoulder pain.  Pain is most notable when getting out of car and using the right arm to push off of the seat.  He also has pain/weakness when he goes to throw a ball such as a golf ball overhand.  Pain primarily along superior aspect of scapula, through trapezius, extending into right shoulder. On exam today, does have some tenderness through periscapular muscles, superior/medial margin of scapula.  Normal passive range of motion, slight reduction with overhead extension.  Empty can negative.  Hawkins with some pain at movement, negative Neer's. Discussed options, given continued symptoms despite conservative measures, physical therapy, we will proceed with MR imaging of the right shoulder for further evaluation.  We will discuss any further treatment recommendations once MRI imaging obtained   ___________________________________________ Iya Hamed de , MD, ABFM, CAQSM Primary Care and Sports Medicine [facility]

## 2021-11-13 NOTE — Patient Instructions (Signed)
  Medication Instructions:  Your physician recommends that you continue on your current medications as directed. Please refer to the Current Medication list given to you today. --If you need a refill on any your medications before your next appointment, please call your pharmacy first. If no refills are authorized on file call the office.-- Lab Work: Your physician has recommended that you have lab work today: No If you have labs (blood work) drawn today and your tests are completely normal, you will receive your results via MyChart message OR a phone call from our staff.  Please ensure you check your voicemail in the event that you authorized detailed messages to be left on a delegated number. If you have any lab test that is abnormal or we need to change your treatment, we will call you to review the results.  Referrals/Procedures/Imaging: Yes, MRI  Follow-Up: Your next appointment:   Your physician recommends that you schedule a follow-up appointment as needed with Dr. de [place].  You will receive a text message or e-mail with a link to a survey about your care and experience with [place] today! We would greatly appreciate your feedback!   Thanks for letting us be apart of your health journey!!  Primary Care and Sports Medicine   Dr. Ceasar Mons [place]   We encourage you to activate your patient portal called "MyChart".  Sign up information is provided on this After Visit Summary.  MyChart is used to connect with patients for Virtual Visits (Telemedicine).  Patients are able to view lab/test results, encounter notes, upcoming appointments, etc.  Non-urgent messages can be sent to your provider as well. To learn more about what you can do with MyChart, please visit --  [email or web address].

## 2021-11-19 ENCOUNTER — Encounter (HOSPITAL_BASED_OUTPATIENT_CLINIC_OR_DEPARTMENT_OTHER): Payer: Managed Care, Other (non HMO) | Admitting: Physical Therapy

## 2021-11-21 ENCOUNTER — Ambulatory Visit (HOSPITAL_BASED_OUTPATIENT_CLINIC_OR_DEPARTMENT_OTHER): Payer: Managed Care, Other (non HMO) | Admitting: Physical Therapy

## 2021-11-21 DIAGNOSIS — M25511 Pain in right shoulder: Secondary | ICD-10-CM

## 2021-11-21 DIAGNOSIS — M62838 Other muscle spasm: Secondary | ICD-10-CM

## 2021-11-21 DIAGNOSIS — M25611 Stiffness of right shoulder, not elsewhere classified: Secondary | ICD-10-CM

## 2021-11-21 NOTE — Therapy (Signed)
Patient comes in today reporting no change at all in symptoms since the beginning of treatment> he has an MRI next week. We talked to him about holding to see the results of the MRI. He was offered treatment today but agrees holding for the MRI is the best bet. We will follow up on the 23rd

## 2021-11-26 ENCOUNTER — Encounter (HOSPITAL_BASED_OUTPATIENT_CLINIC_OR_DEPARTMENT_OTHER): Payer: Managed Care, Other (non HMO) | Admitting: Physical Therapy

## 2021-11-27 ENCOUNTER — Ambulatory Visit
Admission: RE | Admit: 2021-11-27 | Discharge: 2021-11-27 | Disposition: A | Discharge status: Home / Self Care | Payer: Managed Care, Other (non HMO) | Source: Ambulatory Visit | Attending: Family Medicine | Admitting: Family Medicine

## 2021-11-27 DIAGNOSIS — G8929 Other chronic pain: Secondary | ICD-10-CM

## 2021-11-28 ENCOUNTER — Encounter (HOSPITAL_BASED_OUTPATIENT_CLINIC_OR_DEPARTMENT_OTHER): Payer: Managed Care, Other (non HMO) | Admitting: Physical Therapy

## 2021-12-03 ENCOUNTER — Ambulatory Visit (HOSPITAL_BASED_OUTPATIENT_CLINIC_OR_DEPARTMENT_OTHER): Payer: Managed Care, Other (non HMO) | Admitting: Physical Therapy

## 2021-12-17 ENCOUNTER — Ambulatory Visit (INDEPENDENT_AMBULATORY_CARE_PROVIDER_SITE_OTHER): Payer: Managed Care, Other (non HMO) | Admitting: Family Medicine

## 2021-12-17 ENCOUNTER — Encounter (HOSPITAL_BASED_OUTPATIENT_CLINIC_OR_DEPARTMENT_OTHER): Payer: Self-pay | Admitting: Family Medicine

## 2021-12-17 VITALS — BP 118/78 | HR 64 | Ht 71.0 in | Wt 185.0 lb

## 2021-12-17 DIAGNOSIS — M25511 Pain in right shoulder: Secondary | ICD-10-CM

## 2021-12-17 DIAGNOSIS — G8929 Other chronic pain: Secondary | ICD-10-CM

## 2021-12-17 NOTE — Assessment & Plan Note (Signed)
Patient presenting for follow-up of right shoulder pain.  Since last visit, he did have MRI completed of the right shoulder.  He continues to have some pain as well as restriction in range of motion.  He has been working with physical therapy, doing home exercises as per PT.  Generally symptoms feel about the same from prior to initiating PT. 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  Despite osteoarthritis observed at [facility] joint, not currently having any symptoms related to this.  If possible that tendinopathy of supraspinatus is contributing to some of his symptoms, exam today is generally reassuring in regards to potential rotator cuff etiology and so do not feel that it is specifically active at this time. Discussed imaging findings and treatment options given continued restriction in range of motion and pain.  Suspect possibly having some underlying adhesive capsulitis contributing current symptoms given restricted range of motion, continued pain, largely normal imaging findings.  Discussed option of ultrasound-guided glenohumeral joint injection to try to aid in additional relief and allow for more progress when working with physical therapy and doing home exercises.  Discussed potential risks as well as benefits.  After discussion, patient interested in proceeding with procedure, will arrange for this to be done in the near future We will plan for patient to also continue with physical therapy, particularly after completing injection, would plan for continued aggressive conservative management

## 2021-12-17 NOTE — Progress Notes (Signed)
    Procedures performed today:    None.  Independent interpretation of notes and tests performed by another provider:   None.  Brief History, Exam, Impression, and Recommendations:    BP 118/78   Pulse 64   Ht 5\' 11"  (1.803 m)   Wt 185 lb (83.9 kg)   SpO2 99%   BMI 25.80 kg/m   Chronic right shoulder pain Patient presenting for follow-up of right shoulder pain.  Since last visit, he did have MRI completed of the right shoulder.  He continues to have some pain as well as restriction in range of motion.  He has been working with physical therapy, doing home exercises as per PT.  Generally symptoms feel about the same from prior to initiating PT. 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  Despite osteoarthritis observed at [facility] joint, not currently having any symptoms related to this.  If possible that tendinopathy of supraspinatus is contributing to some of his symptoms, exam today is generally reassuring in regards to potential rotator cuff etiology and so do not feel that it is specifically active at this time. Discussed imaging findings and treatment options given continued restriction in range of motion and pain.  Suspect possibly having some underlying adhesive capsulitis contributing current symptoms given restricted range of motion, continued pain, largely normal imaging findings.  Discussed option of ultrasound-guided glenohumeral joint injection to try to aid in additional relief and allow for more progress when working with physical therapy and doing home exercises.  Discussed potential risks as well as benefits.  After discussion, patient interested in proceeding with procedure, will arrange for this to be done in the near future We will plan for patient to also continue with physical therapy, particularly after completing injection, would plan for continued aggressive conservative  management   ___________________________________________ Linsey Arteaga de [facility], MD, ABFM, CAQSM Primary Care and Sports Medicine [facility]

## 2021-12-17 NOTE — Patient Instructions (Signed)
  Medication Instructions:  Your physician recommends that you continue on your current medications as directed. Please refer to the Current Medication list given to you today. --If you need a refill on any your medications before your next appointment, please call your pharmacy first. If no refills are authorized on file call the office.-- Lab Work: Your physician has recommended that you have lab work today: No If you have labs (blood work) drawn today and your tests are completely normal, you will receive your results via MyChart message OR a phone call from our staff.  Please ensure you check your voicemail in the event that you authorized detailed messages to be left on a delegated number. If you have any lab test that is abnormal or we need to change your treatment, we will call you to review the results.  Referrals/Procedures/Imaging: No  Follow-Up: Your next appointment:   Your physician recommends that you schedule a follow-up appointment in: 1 week with Dr. de Cuba.  You will receive a text message or e-mail with a link to a survey about your care and experience with us today! We would greatly appreciate your feedback!   Thanks for letting us be apart of your health journey!!  Primary Care and Sports Medicine   Dr. Raymond de Cuba   We encourage you to activate your patient portal called "MyChart".  Sign up information is provided on this After Visit Summary.  MyChart is used to connect with patients for Virtual Visits (Telemedicine).  Patients are able to view lab/test results, encounter notes, upcoming appointments, etc.  Non-urgent messages can be sent to your provider as well. To learn more about what you can do with MyChart, please visit --  [email or web address].    

## 2021-12-20 ENCOUNTER — Encounter (HOSPITAL_COMMUNITY): Payer: Self-pay | Admitting: *Deleted

## 2021-12-20 ENCOUNTER — Emergency Department (HOSPITAL_COMMUNITY): Payer: Managed Care, Other (non HMO)

## 2021-12-20 ENCOUNTER — Other Ambulatory Visit: Payer: Self-pay

## 2021-12-20 ENCOUNTER — Inpatient Hospital Stay (HOSPITAL_COMMUNITY)
Admission: EM | Admit: 2021-12-20 | Discharge: 2021-12-22 | DRG: 083 | Disposition: A | Discharge status: Home / Self Care | Payer: Managed Care, Other (non HMO) | Attending: Internal Medicine | Admitting: Internal Medicine

## 2021-12-20 DIAGNOSIS — S0101XA Laceration without foreign body of scalp, initial encounter: Secondary | ICD-10-CM | POA: Diagnosis present

## 2021-12-20 DIAGNOSIS — F17210 Nicotine dependence, cigarettes, uncomplicated: Secondary | ICD-10-CM | POA: Diagnosis present

## 2021-12-20 DIAGNOSIS — S7010XA Contusion of unspecified thigh, initial encounter: Secondary | ICD-10-CM

## 2021-12-20 DIAGNOSIS — Z79899 Other long term (current) drug therapy: Secondary | ICD-10-CM | POA: Diagnosis not present

## 2021-12-20 DIAGNOSIS — S7012XA Contusion of left thigh, initial encounter: Secondary | ICD-10-CM | POA: Diagnosis present

## 2021-12-20 DIAGNOSIS — S70212A Abrasion, left hip, initial encounter: Secondary | ICD-10-CM | POA: Diagnosis present

## 2021-12-20 DIAGNOSIS — Z23 Encounter for immunization: Secondary | ICD-10-CM

## 2021-12-20 DIAGNOSIS — Z91013 Allergy to seafood: Secondary | ICD-10-CM

## 2021-12-20 DIAGNOSIS — S3991XA Unspecified injury of abdomen, initial encounter: Secondary | ICD-10-CM | POA: Diagnosis present

## 2021-12-20 DIAGNOSIS — S065XAA Traumatic subdural hemorrhage with loss of consciousness status unknown, initial encounter: Principal | ICD-10-CM | POA: Diagnosis present

## 2021-12-20 DIAGNOSIS — N179 Acute kidney failure, unspecified: Secondary | ICD-10-CM | POA: Diagnosis present

## 2021-12-20 DIAGNOSIS — Z91018 Allergy to other foods: Secondary | ICD-10-CM | POA: Diagnosis not present

## 2021-12-20 DIAGNOSIS — Z720 Tobacco use: Secondary | ICD-10-CM | POA: Diagnosis not present

## 2021-12-20 DIAGNOSIS — Z20822 Contact with and (suspected) exposure to covid-19: Secondary | ICD-10-CM | POA: Diagnosis present

## 2021-12-20 DIAGNOSIS — S82001A Unspecified fracture of right patella, initial encounter for closed fracture: Secondary | ICD-10-CM | POA: Diagnosis present

## 2021-12-20 DIAGNOSIS — S82001S Unspecified fracture of right patella, sequela: Secondary | ICD-10-CM | POA: Diagnosis not present

## 2021-12-20 LAB — COMPREHENSIVE METABOLIC PANEL
ALT: 20 U/L (ref 0–44)
AST: 33 U/L (ref 15–41)
Albumin: 4 g/dL (ref 3.5–5.0)
Alkaline Phosphatase: 46 U/L (ref 38–126)
Anion gap: 10 (ref 5–15)
BUN: 14 mg/dL (ref 6–20)
CO2: 25 mmol/L (ref 22–32)
Calcium: 9.1 mg/dL (ref 8.9–10.3)
Chloride: 107 mmol/L (ref 98–111)
Creatinine, Ser: 1.36 mg/dL — ABNORMAL HIGH (ref 0.61–1.24)
GFR, Estimated: >60 mL/min (ref >60)
Glucose, Bld: 128 mg/dL — ABNORMAL HIGH (ref 70–99)
Potassium: 3.7 mmol/L (ref 3.5–5.1)
Sodium: 142 mmol/L (ref 135–145)
Total Bilirubin: 0.5 mg/dL (ref 0.3–1.2)
Total Protein: 7.3 g/dL (ref 6.5–8.1)

## 2021-12-20 LAB — I-STAT CHEM 8, ED
BUN: 13 mg/dL (ref 6–20)
Calcium, Ion: 0.91 mmol/L — ABNORMAL LOW (ref 1.15–1.40)
Chloride: 105 mmol/L (ref 98–111)
Creatinine, Ser: 1.3 mg/dL — ABNORMAL HIGH (ref 0.61–1.24)
Glucose, Bld: 125 mg/dL — ABNORMAL HIGH (ref 70–99)
HCT: 39 % (ref 39.0–52.0)
Hemoglobin: 13.3 g/dL (ref 13.0–17.0)
Potassium: 3.7 mmol/L (ref 3.5–5.1)
Sodium: 144 mmol/L (ref 135–145)
TCO2: 24 mmol/L (ref 22–32)

## 2021-12-20 LAB — URINALYSIS, ROUTINE W REFLEX MICROSCOPIC
Bacteria, UA: NONE SEEN
Bilirubin Urine: NEGATIVE
Glucose, UA: NEGATIVE mg/dL
Ketones, ur: NEGATIVE mg/dL
Leukocytes,Ua: NEGATIVE
Nitrite: NEGATIVE
Protein, ur: NEGATIVE mg/dL
RBC / HPF: >50 RBC/hpf — ABNORMAL HIGH (ref 0–5)
Specific Gravity, Urine: 1.043 — ABNORMAL HIGH (ref 1.005–1.030)
pH: 9 — ABNORMAL HIGH (ref 5.0–8.0)

## 2021-12-20 LAB — CBC
HCT: 36.7 % — ABNORMAL LOW (ref 39.0–52.0)
Hemoglobin: 12.2 g/dL — ABNORMAL LOW (ref 13.0–17.0)
MCH: 30.1 pg (ref 26.0–34.0)
MCHC: 33.2 g/dL (ref 30.0–36.0)
MCV: 90.6 fL (ref 80.0–100.0)
Platelets: 210 10*3/uL (ref 150–400)
RBC: 4.05 MIL/uL — ABNORMAL LOW (ref 4.22–5.81)
RDW: 14.7 % (ref 11.5–15.5)
WBC: 16 10*3/uL — ABNORMAL HIGH (ref 4.0–10.5)
nRBC: 0 % (ref 0.0–0.2)

## 2021-12-20 LAB — RESP PANEL BY RT-PCR (FLU A&B, COVID) ARPGX2
Influenza A by PCR: NEGATIVE
Influenza B by PCR: NEGATIVE
SARS Coronavirus 2 by RT PCR: NEGATIVE

## 2021-12-20 LAB — ETHANOL: Alcohol, Ethyl (B): <10 mg/dL (ref <10)

## 2021-12-20 LAB — LACTIC ACID, PLASMA: Lactic Acid, Venous: 1.8 mmol/L (ref 0.5–1.9)

## 2021-12-20 LAB — SAMPLE TO BLOOD BANK

## 2021-12-20 LAB — PROTIME-INR
INR: 1.1 (ref 0.8–1.2)
Prothrombin Time: 14.5 seconds (ref 11.4–15.2)

## 2021-12-20 MED ORDER — TETANUS-DIPHTH-ACELL PERTUSSIS 5-2.5-18.5 LF-MCG/0.5 IM SUSY
0.5000 mL | PREFILLED_SYRINGE | Freq: Once | INTRAMUSCULAR | Status: AC
  Administered 2021-12-20: 0.5 mL via INTRAMUSCULAR

## 2021-12-20 MED ORDER — CEFAZOLIN SODIUM-DEXTROSE 2-4 GM/100ML-% IV SOLN
2.0000 g | Freq: Once | INTRAVENOUS | Status: AC
  Administered 2021-12-20: 2 g via INTRAVENOUS

## 2021-12-20 MED ORDER — HYDROMORPHONE HCL 1 MG/ML IJ SOLN
1.0000 mg | Freq: Once | INTRAMUSCULAR | Status: AC
  Administered 2021-12-20: 1 mg via INTRAVENOUS
  Filled 2021-12-20: qty 1

## 2021-12-20 MED ORDER — LEVETIRACETAM IN NACL 500 MG/100ML IV SOLN
500.0000 mg | Freq: Two times a day (BID) | INTRAVENOUS | Status: DC
Start: 2021-12-20 — End: 2021-12-20

## 2021-12-20 MED ORDER — LEVETIRACETAM IN NACL 500 MG/100ML IV SOLN
500.0000 mg | Freq: Two times a day (BID) | INTRAVENOUS | Status: DC
  Administered 2021-12-20 – 2021-12-22 (×4): 500 mg via INTRAVENOUS
  Filled 2021-12-20 (×5): qty 100

## 2021-12-20 MED ORDER — ONDANSETRON HCL 4 MG/2ML IJ SOLN
4.0000 mg | Freq: Once | INTRAMUSCULAR | Status: AC
  Administered 2021-12-20: 4 mg via INTRAVENOUS
  Filled 2021-12-20: qty 2

## 2021-12-20 MED ORDER — SODIUM CHLORIDE 0.9 % IV BOLUS
1000.0000 mL | Freq: Once | INTRAVENOUS | Status: AC
  Administered 2021-12-20: 1000 mL via INTRAVENOUS

## 2021-12-20 MED ORDER — IOHEXOL 350 MG/ML SOLN
75.0000 mL | Freq: Once | INTRAVENOUS | Status: AC | PRN
  Administered 2021-12-20: 75 mL via INTRAVENOUS
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
## 2021-12-20 NOTE — ED Notes (Signed)
Tp c-t and back asking for many blankets
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
## 2021-12-20 NOTE — ED Notes (Signed)
Medical admitting at  the bedside
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
## 2021-12-20 NOTE — ED Notes (Signed)
The pt keeps both his eyes closed he is complaining of being cold yet hes perspiring under the covers  he will answer questions with his eyes closed  pupils are difficult to visualize  the pt keeps trying to keep his eyelids shut   when there are opened he rolls both his eyes up into his head  I think that his pupils are equal and reactive approx size 2.0

## 2021-12-20 NOTE — ED Notes (Signed)
Dr Wallace Cullens has called the neuro doctor on call   dr Virl Son

## 2021-12-20 NOTE — ED Notes (Signed)
Unable to start documentation no help  computer in room not working  unable to store vitals  entering them manually   just now back timing  vitals etc
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
## 2021-12-21 ENCOUNTER — Inpatient Hospital Stay (HOSPITAL_COMMUNITY): Payer: Managed Care, Other (non HMO)

## 2021-12-21 DIAGNOSIS — Z720 Tobacco use: Secondary | ICD-10-CM

## 2021-12-21 DIAGNOSIS — S065XAA Traumatic subdural hemorrhage with loss of consciousness status unknown, initial encounter: Secondary | ICD-10-CM | POA: Diagnosis not present

## 2021-12-21 LAB — COMPREHENSIVE METABOLIC PANEL
ALT: 21 U/L (ref 0–44)
AST: 42 U/L — ABNORMAL HIGH (ref 15–41)
Albumin: 3.8 g/dL (ref 3.5–5.0)
Alkaline Phosphatase: 43 U/L (ref 38–126)
Anion gap: 8 (ref 5–15)
BUN: 10 mg/dL (ref 6–20)
CO2: 24 mmol/L (ref 22–32)
Calcium: 9 mg/dL (ref 8.9–10.3)
Chloride: 108 mmol/L (ref 98–111)
Creatinine, Ser: 1.22 mg/dL (ref 0.61–1.24)
GFR, Estimated: >60 mL/min (ref >60)
Glucose, Bld: 145 mg/dL — ABNORMAL HIGH (ref 70–99)
Potassium: 4.2 mmol/L (ref 3.5–5.1)
Sodium: 140 mmol/L (ref 135–145)
Total Bilirubin: 0.6 mg/dL (ref 0.3–1.2)
Total Protein: 6.9 g/dL (ref 6.5–8.1)

## 2021-12-21 LAB — CBC
HCT: 36.9 % — ABNORMAL LOW (ref 39.0–52.0)
Hemoglobin: 11.8 g/dL — ABNORMAL LOW (ref 13.0–17.0)
MCH: 29.2 pg (ref 26.0–34.0)
MCHC: 32 g/dL (ref 30.0–36.0)
MCV: 91.3 fL (ref 80.0–100.0)
Platelets: 202 10*3/uL (ref 150–400)
RBC: 4.04 MIL/uL — ABNORMAL LOW (ref 4.22–5.81)
RDW: 14.9 % (ref 11.5–15.5)
WBC: 16.8 10*3/uL — ABNORMAL HIGH (ref 4.0–10.5)
nRBC: 0 % (ref 0.0–0.2)

## 2021-12-21 LAB — HIV ANTIBODY (ROUTINE TESTING W REFLEX): HIV Screen 4th Generation wRfx: NONREACTIVE

## 2021-12-21 MED ORDER — DEXTROSE-NACL 5-0.9 % IV SOLN: INTRAVENOUS | Status: DC

## 2021-12-21 MED ORDER — HYDROMORPHONE HCL 1 MG/ML IJ SOLN
0.5000 mg | INTRAMUSCULAR | Status: DC | PRN
  Administered 2021-12-21 (×2): 0.5 mg via INTRAVENOUS
  Filled 2021-12-21 (×2): qty 0.5

## 2021-12-21 MED ORDER — SODIUM CHLORIDE 0.9 % IV SOLN: INTRAVENOUS | Status: DC

## 2021-12-21 MED ORDER — ONDANSETRON HCL 4 MG PO TABS: 4.0000 mg | ORAL_TABLET | Freq: Four times a day (QID) | ORAL | Status: DC | PRN

## 2021-12-21 MED ORDER — ACETAMINOPHEN 325 MG PO TABS
650.0000 mg | ORAL_TABLET | Freq: Four times a day (QID) | ORAL | Status: DC
  Administered 2021-12-21 – 2021-12-22 (×4): 650 mg via ORAL
  Filled 2021-12-21 (×4): qty 2

## 2021-12-21 MED ORDER — IBUPROFEN 200 MG PO TABS
400.0000 mg | ORAL_TABLET | Freq: Four times a day (QID) | ORAL | Status: DC | PRN
  Administered 2021-12-22 (×2): 400 mg via ORAL
  Filled 2021-12-21 (×2): qty 2

## 2021-12-21 MED ORDER — ONDANSETRON HCL 4 MG/2ML IJ SOLN
4.0000 mg | Freq: Four times a day (QID) | INTRAMUSCULAR | Status: DC | PRN
  Administered 2021-12-21 (×3): 4 mg via INTRAVENOUS
  Filled 2021-12-21 (×3): qty 2

## 2021-12-21 MED ORDER — PANTOPRAZOLE SODIUM 40 MG PO TBEC
40.0000 mg | DELAYED_RELEASE_TABLET | Freq: Every day | ORAL | Status: DC
  Administered 2021-12-21 – 2021-12-22 (×2): 40 mg via ORAL
  Filled 2021-12-21 (×2): qty 1

## 2021-12-21 MED ORDER — HYDROMORPHONE HCL 1 MG/ML IJ SOLN
1.0000 mg | INTRAMUSCULAR | Status: DC | PRN
  Administered 2021-12-21 (×2): 1 mg via INTRAVENOUS
  Filled 2021-12-21 (×2): qty 1

## 2021-12-21 MED ORDER — MORPHINE SULFATE (PF) 2 MG/ML IV SOLN: 1.0000 mg | INTRAVENOUS | Status: DC | PRN

## 2021-12-21 MED ORDER — OXYCODONE HCL 5 MG PO TABS
5.0000 mg | ORAL_TABLET | ORAL | Status: DC | PRN
  Filled 2021-12-21: qty 1

## 2021-12-21 MED ORDER — METHOCARBAMOL 1000 MG/10ML IJ SOLN: 500.0000 mg | Freq: Three times a day (TID) | INTRAMUSCULAR | Status: DC | PRN
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
## 2021-12-21 NOTE — ED Notes (Signed)
Neuro surgery at bedside.

## 2021-12-21 NOTE — ED Notes (Signed)
Girlfriend at  the bedside 

## 2021-12-21 NOTE — ED Notes (Signed)
Pt c/o a headache hehis bed is wet from his perspiration   he has perspiring for 10 years especially at night  pupils 3.0 equal and reactive
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
## 2021-12-21 NOTE — Progress Notes (Signed)
PT Cancellation Note  Patient Details Name: Jerry Singh MRN: [id number] DOB: 22-May-1975   Cancelled Treatment:    Reason Eval/Treat Not Completed: Patient at procedure or test/unavailable. Pt heading off unit for CT scan. PT will follow up as time allows.   [name] 12/21/2021, 3:41 PM

## 2021-12-21 NOTE — ED Notes (Signed)
Patient is resting comfortably. Family at bedside.  

## 2021-12-22 ENCOUNTER — Other Ambulatory Visit (HOSPITAL_COMMUNITY): Payer: Self-pay

## 2021-12-22 DIAGNOSIS — S7010XA Contusion of unspecified thigh, initial encounter: Secondary | ICD-10-CM

## 2021-12-22 DIAGNOSIS — S82001S Unspecified fracture of right patella, sequela: Secondary | ICD-10-CM | POA: Diagnosis not present

## 2021-12-22 DIAGNOSIS — S065XAA Traumatic subdural hemorrhage with loss of consciousness status unknown, initial encounter: Secondary | ICD-10-CM | POA: Diagnosis not present

## 2021-12-22 DIAGNOSIS — S82001A Unspecified fracture of right patella, initial encounter for closed fracture: Secondary | ICD-10-CM

## 2021-12-22 MED ORDER — CIPROFLOXACIN-DEXAMETHASONE 0.3-0.1 % OT SUSP
4.0000 [drp] | Freq: Two times a day (BID) | OTIC | Status: DC
  Administered 2021-12-22: 4 [drp] via OTIC
  Filled 2021-12-22: qty 7.5

## 2021-12-22 MED ORDER — LEVETIRACETAM 500 MG PO TABS: 500.0000 mg | ORAL_TABLET | Freq: Two times a day (BID) | ORAL | Status: DC

## 2021-12-22 MED ORDER — LORATADINE 10 MG PO TABS
10.0000 mg | ORAL_TABLET | Freq: Every day | ORAL | 0 refills | Status: AC
  Filled 2021-12-22: qty 14, 14d supply, fill #0

## 2021-12-22 MED ORDER — ACETAMINOPHEN 325 MG PO TABS: 650.0000 mg | ORAL_TABLET | Freq: Four times a day (QID) | ORAL | Status: AC

## 2021-12-22 MED ORDER — METHOCARBAMOL 750 MG PO TABS
750.0000 mg | ORAL_TABLET | Freq: Four times a day (QID) | ORAL | 0 refills | Status: DC | PRN
  Filled 2021-12-22: qty 20, 5d supply, fill #0

## 2021-12-22 MED ORDER — PANTOPRAZOLE SODIUM 40 MG PO TBEC
40.0000 mg | DELAYED_RELEASE_TABLET | Freq: Every day | ORAL | 0 refills | Status: AC
  Filled 2021-12-22: qty 30, 30d supply, fill #0

## 2021-12-22 MED ORDER — LEVETIRACETAM 500 MG PO TABS
500.0000 mg | ORAL_TABLET | Freq: Two times a day (BID) | ORAL | 0 refills | Status: AC
Start: 2021-12-22 — End: ?
  Filled 2021-12-22: qty 12, 6d supply, fill #0

## 2021-12-22 MED ORDER — CIPROFLOXACIN-DEXAMETHASONE 0.3-0.1 % OT SUSP: 4.0000 [drp] | Freq: Two times a day (BID) | OTIC | 0 refills | Status: AC

## 2021-12-22 MED ORDER — IBUPROFEN 400 MG PO TABS
400.0000 mg | ORAL_TABLET | Freq: Three times a day (TID) | ORAL | 0 refills | Status: AC | PRN
Start: 2021-12-22 — End: ?

## 2021-12-22 NOTE — Progress Notes (Signed)
Orthopedic Tech Progress Note Patient Details:  Jerry Singh [birth year] [id number]  Applied right before patient worked with PT   Ortho Devices Type of Ortho Device: Knee Immobilizer Ortho Device/Splint Location: RLE Ortho Device/Splint Interventions: Ordered, Application, Adjustment   Post Interventions Patient Tolerated: Well Instructions Provided: Care of device  [name] 12/22/2021, 9:43 AM

## 2021-12-22 NOTE — Discharge Summary (Addendum)
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 significant diagnostics from this hospitalization (including imaging, microbiology, ancillary and laboratory) are listed below for reference.     Microbiology: Recent Results (from the past 240 hour(s))  Resp Panel by RT-PCR (Flu A&B, Covid) Anterior Nasal Swab     Status: None   Collection Time: 12/20/21  7:26 PM   Specimen: Anterior Nasal Swab  Result Value Ref Range Status   SARS Coronavirus 2 by RT PCR NEGATIVE NEGATIVE Final    Comment: (NOTE) SARS-CoV-2 target nucleic acids are NOT DETECTED.  The SARS-CoV-2 RNA is generally detectable in upper respiratory specimens during the acute phase of infection. The lowest concentration of SARS-CoV-2 viral copies this assay can detect is 138 copies/mL. A negative result does not preclude SARS-Cov-2 infection and should not be used as the sole basis for treatment or other patient management decisions. A negative result may occur with  improper specimen collection/handling, submission of specimen other than nasopharyngeal swab, presence of  viral mutation(s) within the areas targeted by this assay, and inadequate number of viral copies(<138 copies/mL). A negative result must be combined with clinical observations, patient history, and epidemiological information. The expected result is Negative.  Fact Sheet for Patients:  EntrepreneurPulse.com.au  Fact Sheet for Healthcare Providers:  IncredibleEmployment.be  This test is no t yet approved or cleared by the [place] FDA and  has been authorized for detection and/or diagnosis of SARS-CoV-2 by FDA under an Emergency Use Authorization (EUA). This EUA will remain  in effect (meaning this test can be used) for the duration of the COVID-19 declaration under Section 564(b)(1) of the Act, 21 U.S.C.section 360bbb-3(b)(1), unless the authorization is terminated  or revoked sooner.       Influenza A by PCR NEGATIVE NEGATIVE Final   Influenza B by PCR NEGATIVE NEGATIVE Final    Comment: (NOTE) The Xpert Xpress SARS-CoV-2/FLU/RSV plus assay is intended as an aid in the diagnosis of influenza from Nasopharyngeal swab specimens and should not be used as a sole basis for treatment. Nasal washings and aspirates are unacceptable for Xpert Xpress SARS-CoV-2/FLU/RSV testing.  Fact Sheet for Patients: EntrepreneurPulse.com.au  Fact Sheet for Healthcare Providers: IncredibleEmployment.be  This test is not yet approved or cleared by the [place] FDA and has been authorized for detection and/or diagnosis of SARS-CoV-2 by FDA under an Emergency Use Authorization (EUA). This EUA  will remain in effect (meaning this test can be used) for the duration of the COVID-19 declaration under Section 564(b)(1) of the Act, 21 U.S.C. section 360bbb-3(b)(1), unless the authorization is terminated or revoked.  Performed at [facility], [address], [place], [place] [address]      Labs: BNP (last 3  results) No results for input(s): "BNP" in the last 8760 hours. Basic Metabolic Panel: Recent Labs  Lab 12/20/21 1630 12/20/21 1641 12/21/21 0633  NA 142 144 140  K 3.7 3.7 4.2  CL 107 105 108  CO2 25  --  24  GLUCOSE 128* 125* 145*  BUN 14 13 10   CREATININE 1.36* 1.30* 1.22  CALCIUM 9.1  --  9.0   Liver Function Tests: Recent Labs  Lab 12/20/21 1630 12/21/21 0633  AST 33 42*  ALT 20 21  ALKPHOS 46 43  BILITOT 0.5 0.6  PROT 7.3 6.9  ALBUMIN 4.0 3.8   No results for input(s): "LIPASE", "AMYLASE" in the last 168 hours. No results for input(s): "AMMONIA" in the last 168 hours. CBC: Recent Labs  Lab 12/20/21 1630 12/20/21 1641 12/21/21 0633  WBC 16.0*  --  16.8*  HGB 12.2* 13.3 11.8*  HCT 36.7* 39.0 36.9*  MCV 90.6  --  91.3  PLT 210  --  202   Cardiac Enzymes: No results for input(s): "CKTOTAL", "CKMB", "CKMBINDEX", "TROPONINI" in the last 168 hours. BNP: Invalid input(s): "POCBNP" CBG: No results for input(s): "GLUCAP" in the last 168 hours. D-Dimer No results for input(s): "DDIMER" in the last 72 hours. Hgb A1c No results for input(s): "HGBA1C" in the last 72 hours. Lipid Profile No results for input(s): "CHOL", "HDL", "LDLCALC", "TRIG", "CHOLHDL", "LDLDIRECT" in the last 72 hours. Thyroid function studies No results for input(s): "TSH", "T4TOTAL", "T3FREE", "THYROIDAB" in the last 72 hours.  Invalid input(s): "FREET3" Anemia work up No results for input(s): "VITAMINB12", "FOLATE", "FERRITIN", "TIBC", "IRON", "RETICCTPCT" in the last 72 hours. Urinalysis    Component Value Date/Time   COLORURINE STRAW (A) 12/20/2021 2000   APPEARANCEUR CLEAR 12/20/2021 2000   LABSPEC 1.043 (H) 12/20/2021 2000   PHURINE 9.0 (H) 12/20/2021 2000   GLUCOSEU NEGATIVE 12/20/2021 2000   HGBUR MODERATE (A) 12/20/2021 2000   BILIRUBINUR NEGATIVE 12/20/2021 2000   KETONESUR NEGATIVE 12/20/2021 2000   PROTEINUR NEGATIVE 12/20/2021 2000   UROBILINOGEN 2.0 (H) 10/16/2012  1117   NITRITE NEGATIVE 12/20/2021 2000   LEUKOCYTESUR NEGATIVE 12/20/2021 2000   Sepsis Labs Recent Labs  Lab 12/20/21 1630 12/21/21 0633  WBC 16.0* 16.8*   Microbiology Recent Results (from the past 240 hour(s))  Resp Panel by RT-PCR (Flu A&B, Covid) Anterior Nasal Swab     Status: None   Collection Time: 12/20/21  7:26 PM   Specimen: Anterior Nasal Swab  Result Value Ref Range Status   SARS Coronavirus 2 by RT PCR NEGATIVE NEGATIVE Final    Comment: (NOTE) SARS-CoV-2 target nucleic acids are NOT DETECTED.  The SARS-CoV-2 RNA is generally detectable in upper respiratory specimens during the acute phase of infection. The lowest concentration of SARS-CoV-2 viral copies this assay can detect is 138 copies/mL. A negative result does not preclude SARS-Cov-2 infection and should not be used as the sole basis for treatment or other patient management decisions. A negative result may occur with  improper specimen collection/handling, submission of specimen other than nasopharyngeal swab, presence of viral mutation(s) within the areas targeted by this assay, and inadequate number of viral copies(<138 copies/mL). A  negative result must be combined with clinical observations, patient history, and epidemiological information. The expected result is Negative.  Fact Sheet for Patients:  BloggerCourse.com  Fact Sheet for Healthcare Providers:  SeriousBroker.it  This test is no t yet approved or cleared by the [place] FDA and  has been authorized for detection and/or diagnosis of SARS-CoV-2 by FDA under an Emergency Use Authorization (EUA). This EUA will remain  in effect (meaning this test can be used) for the duration of the COVID-19 declaration under Section 564(b)(1) of the Act, 21 U.S.C.section 360bbb-3(b)(1), unless the authorization is terminated  or revoked sooner.       Influenza A by PCR NEGATIVE NEGATIVE Final    Influenza B by PCR NEGATIVE NEGATIVE Final    Comment: (NOTE) The Xpert Xpress SARS-CoV-2/FLU/RSV plus assay is intended as an aid in the diagnosis of influenza from Nasopharyngeal swab specimens and should not be used as a sole basis for treatment. Nasal washings and aspirates are unacceptable for Xpert Xpress SARS-CoV-2/FLU/RSV testing.  Fact Sheet for Patients: BloggerCourse.com  Fact Sheet for Healthcare Providers: SeriousBroker.it  This test is not yet approved or cleared by the [place] FDA and has been authorized for detection and/or diagnosis of SARS-CoV-2 by FDA under an Emergency Use Authorization (EUA). This EUA will remain in effect (meaning this test can be used) for the duration of the COVID-19 declaration under Section 564(b)(1) of the Act, 21 U.S.C. section 360bbb-3(b)(1), unless the authorization is terminated or revoked.  Performed at [facility], [address] [place], [place] [address]      Time coordinating discharge: 30 minutes  SIGNED:   Huey Bienenstock, MD  [facility] 12/22/2021, 3:27 PM Pager   If 7PM-7AM, please contact night-coverage [email or web address] Password [email or web address]
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
## 2021-12-22 NOTE — Plan of Care (Signed)

## 2021-12-22 NOTE — Discharge Instructions (Signed)
     Discharge Instructions    Attending Surgeon: Huel Cote, MD Office Phone Number: [phone]   Weight wearing as tolerated on your right leg with brace in place
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
## 2021-12-22 NOTE — TOC Transition Note (Signed)
Transition of Care [facility]) - CM/SW Discharge Note   Patient Details  Name: Jerry Singh MRN: [id number] Date of Birth: 08-16-1975  [facility]) CM/SW Contact:  Kermit Balo, RN Phone Number: 12/22/2021, 3:54 PM   Clinical Narrative:    Pt is discharging home with his SO.  Pt has transport home.    Final next level of care: Home/Self Care Barriers to Discharge: Continued Medical Work up   Patient Goals and CMS Choice     Choice offered to / list presented to : Patient  Discharge Placement                       Discharge Plan and Services   Discharge Planning Services: CM Consult Post Acute Care Choice: Durable Medical Equipment          DME Arranged: Bedside commode DME Agency: [employer] Date DME Agency Contacted: 12/22/21   Representative spoke with at DME Agency: Lawernce Keas            Social Determinants of Health (SDOH) Interventions     Readmission Risk Interventions     No data to display

## 2021-12-22 NOTE — Progress Notes (Signed)
Mobility Specialist: Progress Note   12/22/21 1239  Mobility  Activity Ambulated with assistance in hallway  Level of Assistance Minimal assist, patient does 75% or more  Assistive Device Front wheel walker  RLE Weight Bearing WBAT  Distance Ambulated (ft) 240 ft  Activity Response Tolerated well  $Mobility charge 1 Mobility   Pre-Mobility: 67 HR, 133/83 (98) BP, 98% SpO2 Post-Mobility: 66 HR, 135/78 (94) BP, 97% SpO2  Pt received in bed and agreeable to mobility. Donned KI and required minA with RLE bed mobility, contact guard during ambulation. C/o general pain he rated 5/10 as well as mild dizziness after sitting EOB, resolved during ambulation. Pt to bed after session with all needs met.   [facility] Soila [employer] Mobility Specialist Mobility Specialist [address]: [phone]

## 2021-12-24 ENCOUNTER — Ambulatory Visit (INDEPENDENT_AMBULATORY_CARE_PROVIDER_SITE_OTHER): Payer: Managed Care, Other (non HMO) | Admitting: Orthopaedic Surgery

## 2021-12-24 DIAGNOSIS — S82001S Unspecified fracture of right patella, sequela: Secondary | ICD-10-CM
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
## 2021-12-25 ENCOUNTER — Ambulatory Visit (HOSPITAL_BASED_OUTPATIENT_CLINIC_OR_DEPARTMENT_OTHER): Payer: Managed Care, Other (non HMO) | Admitting: Family Medicine

## 2021-12-25 ENCOUNTER — Ambulatory Visit (INDEPENDENT_AMBULATORY_CARE_PROVIDER_SITE_OTHER): Payer: Managed Care, Other (non HMO)

## 2021-12-25 DIAGNOSIS — M25511 Pain in right shoulder: Secondary | ICD-10-CM

## 2021-12-25 DIAGNOSIS — G8929 Other chronic pain: Secondary | ICD-10-CM | POA: Diagnosis not present

## 2021-12-25 NOTE — Assessment & Plan Note (Signed)
Patient presents for follow-up of right shoulder pain.  At last office visit we reviewed symptoms, MRI, progress with conservative measures, physical therapy.  Given his continued symptoms, we discussed option of ultrasound-guided glenohumeral joint injection.  He decided he wanted to proceed with this and presents for this procedure today.  Again reviewed procedure, discussed potential risks and expected benefits of planned procedure.  After discussion, patient elected to proceed with procedure today.  See procedure note above We will plan for follow-up in about 4 to 6 weeks to assess progress.  He may resume physical therapy, home exercise program as per PT and we will check on progress at follow-up visit

## 2021-12-25 NOTE — Progress Notes (Signed)
    Procedures performed today:    Procedure: Real-time Ultrasound Guided injection of the right glenohumeral joint Device: Samsung HS60  Verbal informed consent obtained.  Time-out conducted.  Noted no overlying erythema, induration, or other signs of local infection.  Skin prepped in a sterile fashion.  Local anesthesia: None With sterile technique and under real time ultrasound guidance: 1 cc Kenalog 40, 4 cc 1% lidocaine injected easily Completed without difficulty  Advised to call if fevers/chills, erythema, induration, drainage, or persistent bleeding.  Images permanently stored and available for review in PACS.  Impression: Technically successful ultrasound guided injection.  Independent interpretation of notes and tests performed by another provider:   None.  Brief History, Exam, Impression, and Recommendations:    Chronic right shoulder pain Patient presents for follow-up of right shoulder pain.  At last office visit we reviewed symptoms, MRI, progress with conservative measures, physical therapy.  Given his continued symptoms, we discussed option of ultrasound-guided glenohumeral joint injection.  He decided he wanted to proceed with this and presents for this procedure today.  Again reviewed procedure, discussed potential risks and expected benefits of planned procedure.  After discussion, patient elected to proceed with procedure today.  See procedure note above We will plan for follow-up in about 4 to 6 weeks to assess progress.  He may resume physical therapy, home exercise program as per PT and we will check on progress at follow-up visit  Return in about 6 weeks (around 02/05/2022) for Right shoulder pain.   ___________________________________________ Bralynn Donado de [place], MD, ABFM, [facility]

## 2022-01-07 ENCOUNTER — Ambulatory Visit (HOSPITAL_BASED_OUTPATIENT_CLINIC_OR_DEPARTMENT_OTHER): Payer: Managed Care, Other (non HMO) | Admitting: Orthopaedic Surgery

## 2022-01-26 ENCOUNTER — Encounter: Payer: Self-pay | Admitting: *Deleted

## 2022-01-27 ENCOUNTER — Ambulatory Visit (INDEPENDENT_AMBULATORY_CARE_PROVIDER_SITE_OTHER): Payer: Managed Care, Other (non HMO) | Admitting: Psychiatry

## 2022-01-27 ENCOUNTER — Encounter: Payer: Self-pay | Admitting: Psychiatry

## 2022-01-27 VITALS — BP 118/85 | HR 76 | Ht 71.0 in | Wt 174.8 lb

## 2022-01-27 DIAGNOSIS — F0781 Postconcussional syndrome: Secondary | ICD-10-CM

## 2022-01-27 DIAGNOSIS — G44309 Post-traumatic headache, unspecified, not intractable: Secondary | ICD-10-CM

## 2022-01-27 NOTE — Patient Instructions (Addendum)
Post Concussive Syndrome:  Post-concussion syndrome is a complex disorder in which various symptoms -- such as headaches and dizziness -- last for weeks and sometimes months after the injury that caused the concussion. Concussion is a mild traumatic brain injury, usually occurring after a blow to the head. Loss of consciousness isn't required for a diagnosis of concussion or post-concussion syndrome. In fact, the risk of post-concussion syndrome doesn't appear to be associated with the severity of the initial injury. In most people, post-concussion syndrome symptoms occur within the first seven to 10 days and go away within three months, though they can persist for a year or more. Post-concussion syndrome treatments are aimed at easing specific symptoms.  Post-concussion symptoms include: Headaches  Dizziness  Fatigue  Irritability  Anxiety  Insomnia  Loss of concentration and memory  Noise and light sensitivity Headaches that occur after a concussion can vary and may feel like tension-type headaches or migraines. Most, however, are tension-type headaches, which may be associated with a neck injury that happened at the same time as the head injury. In some cases, people experience behavior or emotional changes after a mild traumatic brain injury. Family members may notice that the person has become more irritable, suspicious, argumentative or stubborn.  When to see a doctor See a doctor if you experience a head injury severe enough to cause confusion or amnesia -- even if you never lost consciousness. If a concussion occurs while you're playing a sport, don't go back in the game. Seek medical attention so that you don't risk worsening your injury.  Causes: In many cases, both physiological effects of brain trauma and emotional reactions to these effects play a role in the development of symptoms. Researchers haven't determined why some people who've had concussions develop persistent  post-concussion symptoms while others do not. No proven correlation between the severity of the injury and the likelihood of developing persistent post-concussion symptoms exists.  Treatment: There is no specific treatment for post-concussion syndrome. Instead, your doctor will treat the individual symptoms you're experiencing. The types of symptoms and their frequency are unique to each person. Headaches Medications commonly used for migraines or tension headaches, including some antidepressants, appear to be effective when these types of headaches are associated with post-concussion syndrome. Examples include: Amitriptyline. This medication has been widely used for post-traumatic injuries, as well as for symptoms commonly associated with post-concussion syndrome, such as irritability, dizziness and depression. Topiramate. Commonly used to treat migraines, topiramate (Qudexy XR, Topamax, Trokendi XR) may be effective in reducing headaches after head injury. Common side effects of topiramate include weight loss and cognitive problems.  Gabapentin. Gabapentin (Gralise, Neurontin) is frequently used to treat a variety of types of pain and may be helpful in treating post-traumatic headaches. A common side effect of gabapentin is drowsiness.  Other agents used to treat migraines and tension-type headaches may also be helpful in some individuals. Keep in mind that the overuse of over-the-counter and prescription pain relievers may contribute to persistent post-concussion headaches.  Memory and thinking problems No medications are currently recommended specifically for the treatment of cognitive problems after mild traumatic brain injury. Time may be the best therapy for post-concussion syndrome if you have cognitive problems, as most of them go away on their own in the weeks to months following the injury. Certain forms of cognitive therapy may be helpful, including focused rehabilitation that provides  training in how to use a pocket calendar, Education officer, community or other techniques to work around [employer] deficits  and attention skills. Relaxation therapy also may help.  Dizziness Vestibular rehab (a specialized form of physical therapy can help this.  Depression and anxiety The symptoms of post-concussion syndrome often improve after the affected person learns that there is a cause for his or her symptoms and that they will likely improve with time. Education about the disorder can ease a person's fears and help provide peace of mind. If you're experiencing new or increasing depression or anxiety after a concussion, some treatment options include: Psychotherapy. It may be helpful to discuss your concerns with a psychologist or psychiatrist who has experience in working with people with brain injury.  Medication. To combat anxiety or depression, antidepressants or anti-anxiety medications may be prescribed.  Prevention: The only known way to prevent post-concussion syndrome is to avoid the head injury in the first place. Avoiding head injuries Although you can't prepare for every potential situation, here are some tips for avoiding common causes of head injuries: Fasten your seat belt whenever you're traveling in a car, and be sure children are in age-appropriate safety seats. Children under 13 are safest riding in the back seat, especially if your car has air bags.  Use helmets whenever you or your children are bicycling, roller-skating, in-line skating, ice-skating, skiing, snowboarding, playing football, batting or running the bases in softball or baseball, skateboarding, or horseback riding. Wear a helmet when riding a motorcycle.  Take steps around the house to prevent falls, such as removing small area rugs, improving lighting and installing handrails.
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
## 2022-01-29 ENCOUNTER — Encounter (HOSPITAL_BASED_OUTPATIENT_CLINIC_OR_DEPARTMENT_OTHER): Payer: Self-pay | Admitting: Family Medicine

## 2022-01-29 ENCOUNTER — Ambulatory Visit (INDEPENDENT_AMBULATORY_CARE_PROVIDER_SITE_OTHER): Payer: Managed Care, Other (non HMO) | Admitting: Family Medicine

## 2022-01-29 VITALS — BP 124/88 | HR 74 | Ht 71.0 in | Wt 185.0 lb

## 2022-01-29 DIAGNOSIS — G8929 Other chronic pain: Secondary | ICD-10-CM | POA: Diagnosis not present

## 2022-01-29 DIAGNOSIS — M25511 Pain in right shoulder: Secondary | ICD-10-CM

## 2022-01-29 NOTE — Patient Instructions (Signed)
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
## 2022-01-29 NOTE — Assessment & Plan Note (Signed)
Patient presents for follow-up from recent glenohumeral joint injection.  Unfortunately, he indicates that he feels that symptoms are mostly unchanged, still with notable restriction in range of motion, discomfort at ends of range of motion.  Again reviewed imaging findings with patient.   On exam today, patient is in no acute distress.  He does not have any tenderness to palpation about the shoulder.  He does have restriction in range of motion, primarily with shoulder abduction as well as internal and external rotation.  Negative empty can, Hawkins.  Restriction range of motion with Neer's test, mild discomfort with this. Given lack of notable changes since last appointment/procedure, feel that further evaluation with orthopedic surgeon would be warranted.  We will place referral today.  Given history, exam, imaging findings, feel that symptoms are most likely related to adhesive capsulitis and he may require consideration of surgical intervention

## 2022-02-06 ENCOUNTER — Ambulatory Visit (HOSPITAL_BASED_OUTPATIENT_CLINIC_OR_DEPARTMENT_OTHER): Payer: Managed Care, Other (non HMO)

## 2022-02-06 ENCOUNTER — Ambulatory Visit (INDEPENDENT_AMBULATORY_CARE_PROVIDER_SITE_OTHER): Payer: Managed Care, Other (non HMO) | Admitting: Orthopaedic Surgery

## 2022-02-06 ENCOUNTER — Other Ambulatory Visit (HOSPITAL_BASED_OUTPATIENT_CLINIC_OR_DEPARTMENT_OTHER): Payer: Self-pay | Admitting: Orthopaedic Surgery

## 2022-02-06 DIAGNOSIS — S82001S Unspecified fracture of right patella, sequela: Secondary | ICD-10-CM
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
## 2022-02-23 ENCOUNTER — Ambulatory Visit (INDEPENDENT_AMBULATORY_CARE_PROVIDER_SITE_OTHER): Payer: Managed Care, Other (non HMO) | Admitting: Orthopaedic Surgery

## 2022-02-23 DIAGNOSIS — S82001S Unspecified fracture of right patella, sequela: Secondary | ICD-10-CM | POA: Diagnosis not present
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
## 2022-04-27 ENCOUNTER — Ambulatory Visit (HOSPITAL_BASED_OUTPATIENT_CLINIC_OR_DEPARTMENT_OTHER): Payer: Managed Care, Other (non HMO) | Admitting: Orthopaedic Surgery

## 2022-04-30 ENCOUNTER — Ambulatory Visit (HOSPITAL_BASED_OUTPATIENT_CLINIC_OR_DEPARTMENT_OTHER): Payer: Managed Care, Other (non HMO) | Admitting: Orthopaedic Surgery

## 2022-04-30 ENCOUNTER — Ambulatory Visit (HOSPITAL_BASED_OUTPATIENT_CLINIC_OR_DEPARTMENT_OTHER): Payer: Managed Care, Other (non HMO)

## 2022-04-30 DIAGNOSIS — S82001S Unspecified fracture of right patella, sequela: Secondary | ICD-10-CM
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
## 2022-05-21 ENCOUNTER — Telehealth (HOSPITAL_BASED_OUTPATIENT_CLINIC_OR_DEPARTMENT_OTHER): Payer: Self-pay | Admitting: Family Medicine

## 2022-05-21 NOTE — Telephone Encounter (Signed)
Pt Advised NO to FLU Vaccine

## 2022-06-24 ENCOUNTER — Encounter (HOSPITAL_BASED_OUTPATIENT_CLINIC_OR_DEPARTMENT_OTHER): Payer: Self-pay

## 2022-07-20 ENCOUNTER — Emergency Department (HOSPITAL_BASED_OUTPATIENT_CLINIC_OR_DEPARTMENT_OTHER)
Admission: EM | Admit: 2022-07-20 | Discharge: 2022-07-20 | Disposition: A | Discharge status: Home / Self Care | Payer: Managed Care, Other (non HMO) | Attending: Emergency Medicine | Admitting: Emergency Medicine

## 2022-07-20 ENCOUNTER — Other Ambulatory Visit: Payer: Self-pay

## 2022-07-20 ENCOUNTER — Emergency Department (HOSPITAL_BASED_OUTPATIENT_CLINIC_OR_DEPARTMENT_OTHER): Payer: Managed Care, Other (non HMO)

## 2022-07-20 ENCOUNTER — Encounter (HOSPITAL_BASED_OUTPATIENT_CLINIC_OR_DEPARTMENT_OTHER): Payer: Self-pay

## 2022-07-20 DIAGNOSIS — S0993XA Unspecified injury of face, initial encounter: Secondary | ICD-10-CM | POA: Diagnosis present

## 2022-07-20 DIAGNOSIS — W2105XA Struck by basketball, initial encounter: Secondary | ICD-10-CM | POA: Diagnosis not present

## 2022-07-20 DIAGNOSIS — Y9367 Activity, basketball: Secondary | ICD-10-CM | POA: Diagnosis not present

## 2022-07-20 DIAGNOSIS — S022XXA Fracture of nasal bones, initial encounter for closed fracture: Secondary | ICD-10-CM | POA: Diagnosis not present

## 2022-07-20 MED ORDER — AMOXICILLIN-POT CLAVULANATE 875-125 MG PO TABS
1.0000 | ORAL_TABLET | Freq: Once | ORAL | Status: AC
  Administered 2022-07-20: 1 via ORAL
  Filled 2022-07-20: qty 1

## 2022-07-20 MED ORDER — TETANUS-DIPHTH-ACELL PERTUSSIS 5-2.5-18.5 LF-MCG/0.5 IM SUSY
0.5000 mL | PREFILLED_SYRINGE | Freq: Once | INTRAMUSCULAR | Status: DC
  Filled 2022-07-20: qty 0.5

## 2022-07-20 MED ORDER — HYDROCODONE-ACETAMINOPHEN 5-325 MG PO TABS
1.0000 | ORAL_TABLET | Freq: Once | ORAL | Status: AC
  Administered 2022-07-20: 1 via ORAL
  Filled 2022-07-20: qty 1

## 2022-07-20 MED ORDER — AMOXICILLIN-POT CLAVULANATE 875-125 MG PO TABS: 1.0000 | ORAL_TABLET | Freq: Two times a day (BID) | ORAL | 0 refills | Status: AC

## 2022-07-20 MED ORDER — HYDROCODONE-ACETAMINOPHEN 5-325 MG PO TABS: 1.0000 | ORAL_TABLET | Freq: Four times a day (QID) | ORAL | 0 refills | Status: AC | PRN

## 2022-07-20 NOTE — Discharge Instructions (Addendum)
Please follow-up with your primary care doctor, make sure you are using intranasal saline, or using a bulb suction, day blow your nose.  Do not blow your nose eat spicy food, or touch her nose as this may cause more trauma to it.  Make sure you are icing it every 15 minutes, and follow-up with ENT in 5 to 7 days.

## 2022-07-20 NOTE — ED Provider Notes (Signed)
[place] [facility] Provider Note   CSN: [id number] Arrival date & time: 07/20/22  2111     History  Chief Complaint  Patient presents with   Facial Injury    Jerry Singh is a 47 y.o. male, pertinent past medical history, who presents to the ED secondary to facial pain after being elbowed in the nose while playing basketball.  He states he was playing basketball elbowed in the nose, and had immediate pain, he states he fell backwards and hit his head, and cannot remember what happened.  Is not on any blood thinners.  States he has had some bleeding from his nose since then, but no blood in his mouth.  Last tetanus 2023     Home Medications Prior to Admission medications   Medication Sig Start Date End Date Taking? Authorizing Provider  amoxicillin-clavulanate (AUGMENTIN) 875-125 MG tablet Take 1 tablet by mouth every 12 (twelve) hours. 07/20/22  Yes Javonna Balli L, PA  HYDROcodone-acetaminophen (NORCO) 5-325 MG tablet Take 1 tablet by mouth every 6 (six) hours as needed for moderate pain. 07/20/22  Yes Johannah Rozas L, PA  acetaminophen (TYLENOL) 325 MG tablet Take 2 tablets (650 mg total) by mouth every 6 (six) hours. 12/22/21   Elgergawy, Leana Roe, MD  ciprofloxacin-dexamethasone (CIPRODEX) OTIC suspension Place 4 drops into the left ear 2 (two) times daily. 12/22/21   Elgergawy, Leana Roe, MD  ibuprofen (ADVIL) 400 MG tablet Take 1 tablet (400 mg total) by mouth every 8 (eight) hours as needed for moderate pain. 12/22/21   Elgergawy, Leana Roe, MD  levETIRAcetam (KEPPRA) 500 MG tablet Take 1 tablet (500 mg total) by mouth 2 (two) times daily. 12/22/21   Elgergawy, Leana Roe, MD  loratadine (CLARITIN) 10 MG tablet Take 1 tablet (10 mg total) by mouth daily for 14 days. 12/22/21 01/05/22  Elgergawy, Leana Roe, MD  methocarbamol (ROBAXIN-750) 750 MG tablet Take 1 tablet (750 mg total) by mouth every 6 (six) hours as needed for muscle spasms. 12/22/21    Elgergawy, Leana Roe, MD  pantoprazole (PROTONIX) 40 MG tablet Take 1 tablet (40 mg total) by mouth daily. Patient not taking: Reported on 01/29/2022 12/23/21   Elgergawy, Leana Roe, MD      Allergies    Food    Review of Systems   Review of Systems  HENT:  Positive for facial swelling. Negative for dental problem.     Physical Exam Updated Vital Signs BP 138/89   Pulse 84   Temp 99.4 F (37.4 C) (Oral)   Resp 18   Ht 5\' 11"  (1.803 m)   Wt 79.4 kg   SpO2 100%   BMI 24.41 kg/m  Physical Exam Vitals and nursing note reviewed.  Constitutional:      General: He is not in acute distress.    Appearance: He is well-developed.  HENT:     Head: Normocephalic and atraumatic. No raccoon eyes or Battle's sign.     Right Ear: Tympanic membrane normal. No hemotympanum.     Left Ear: Tympanic membrane normal. No hemotympanum.     Nose: Nasal deformity, septal deviation and nasal tenderness present.     Right Nostril: Epistaxis present. No septal hematoma.     Left Nostril: No epistaxis or septal hematoma.     Comments: +nasal deformity, leaning towards L Eyes:     Conjunctiva/sclera: Conjunctivae normal.  Cardiovascular:     Rate and Rhythm: Normal rate and regular rhythm.  Heart sounds: No murmur heard. Pulmonary:     Effort: Pulmonary effort is normal. No respiratory distress.     Breath sounds: Normal breath sounds.  Abdominal:     Palpations: Abdomen is soft.     Tenderness: There is no abdominal tenderness.  Musculoskeletal:        General: No swelling.     Cervical back: Neck supple.  Skin:    General: Skin is warm and dry.     Capillary Refill: Capillary refill takes less than 2 seconds.  Neurological:     Mental Status: He is alert.  Psychiatric:        Mood and Affect: Mood normal.     ED Results / Procedures / Treatments   Labs (all labs ordered are listed, but only abnormal results are displayed) Labs Reviewed - No data to  display  EKG None  Radiology CT Maxillofacial Wo Contrast  Result Date: 07/20/2022 CLINICAL DATA:  Status post trauma. EXAM: CT MAXILLOFACIAL WITHOUT CONTRAST TECHNIQUE: Multidetector CT imaging of the maxillofacial structures was performed. Multiplanar CT image reconstructions were also generated. RADIATION DOSE REDUCTION: This exam was performed according to the departmental dose-optimization program which includes automated exposure control, adjustment of the mA and/or kV according to patient size and/or use of iterative reconstruction technique. COMPARISON:  None Available. FINDINGS: Osseous: Acute, comminuted, bilateral mildly displaced nasal bone fractures are seen. Orbits: Negative. No traumatic or inflammatory finding. Sinuses: Clear. Soft tissues: There is mild bilateral paranasal soft tissue swelling. Limited intracranial: No significant or unexpected finding. IMPRESSION: Acute, bilateral mildly displaced nasal bone fractures. Electronically Signed   By: Aram Candelahaddeus  Houston M.D.   On: 07/20/2022 22:09   CT Head Wo Contrast  Result Date: 07/20/2022 CLINICAL DATA:  Status post trauma. EXAM: CT HEAD WITHOUT CONTRAST TECHNIQUE: Contiguous axial images were obtained from the base of the skull through the vertex without intravenous contrast. RADIATION DOSE REDUCTION: This exam was performed according to the departmental dose-optimization program which includes automated exposure control, adjustment of the mA and/or kV according to patient size and/or use of iterative reconstruction technique. COMPARISON:  None Available. FINDINGS: Brain: No evidence of acute infarction, hemorrhage, hydrocephalus, extra-axial collection or mass lesion/mass effect. Vascular: No hyperdense vessel or unexpected calcification. Skull: Acute, bilateral, mildly displaced nasal bone fractures are seen. Sinuses/Orbits: No acute finding. Other: There is mild bilateral paranasal soft tissue swelling. IMPRESSION: 1. No acute  intracranial abnormality. 2. Acute, bilateral, mildly displaced nasal bone fractures. Electronically Signed   By: Aram Candelahaddeus  Houston M.D.   On: 07/20/2022 22:06    Procedures Procedures    Medications Ordered in ED Medications  Tdap (BOOSTRIX) injection 0.5 mL (0.5 mLs Intramuscular Not Given 07/20/22 2205)  amoxicillin-clavulanate (AUGMENTIN) 875-125 MG per tablet 1 tablet (1 tablet Oral Given 07/20/22 2205)  HYDROcodone-acetaminophen (NORCO/VICODIN) 5-325 MG per tablet 1 tablet (1 tablet Oral Given 07/20/22 2205)    ED Course/ Medical Decision Making/ A&P                             Medical Decision Making Patient is a 47 year old male, here with an obvious nasal bone fracture, we will obtain a CT max, for further evaluation as well as update his tetanus, he states his tetanus was updated in 2023, thus he declined.  Additionally we will start him on Augmentin for possible open fracture.  CT max ordered.  Pain medication for this.  Amount and/or Complexity of Data Reviewed  Radiology: ordered.    Details: CT maxillofacial, shows bilateral displaced nasal bone fractures Discussion of management or test interpretation with external provider(s): Discussed with Dr. Jearld Fenton, he recommends patient follow-up in 5 to 7 days.  He states that will not perform inpatient.  Discussed with patient we will put him on Augmentin, Hu possible open fracture, no septal hematoma noted however, tetanus is already up-to-date.  He will follow-up with Dr. Jearld Fenton, and I prescribed him a short dose of pain medication to help with this.  We discussed return for symptoms, and he voiced understanding.  Risk Prescription drug management.   Final Clinical Impression(s) / ED Diagnoses Final diagnoses:  Closed displaced fracture of nasal bone, initial encounter    Rx / DC Orders ED Discharge Orders          Ordered    HYDROcodone-acetaminophen (NORCO) 5-325 MG tablet  Every 6 hours PRN        07/20/22 2239     amoxicillin-clavulanate (AUGMENTIN) 875-125 MG tablet  Every 12 hours        07/20/22 2239              Latresha Yahr, Harley Alto, PA 07/20/22 2302    Charlynne Pander, MD 07/20/22 2337
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
## 2022-07-21 ENCOUNTER — Telehealth (HOSPITAL_BASED_OUTPATIENT_CLINIC_OR_DEPARTMENT_OTHER): Payer: Self-pay

## 2022-07-21 NOTE — Telephone Encounter (Signed)
Pt called and asked if he needed a f/u appt from ed.

## 2022-07-22 ENCOUNTER — Other Ambulatory Visit: Payer: Self-pay | Admitting: Otolaryngology

## 2022-07-22 ENCOUNTER — Other Ambulatory Visit: Payer: Self-pay

## 2022-07-22 ENCOUNTER — Encounter (HOSPITAL_COMMUNITY): Payer: Self-pay | Admitting: Otolaryngology

## 2022-07-22 NOTE — Progress Notes (Addendum)
PCP -  Cardiologist - denies  PPM/ICD - denies  CPAP - positive for OSA but not using CPAP  Fasting Blood Sugar - n/a  Blood Thinner Instructions: n/a Patient was instructed: As of today, STOP taking any Aspirin (unless otherwise instructed by your surgeon) Aleve, Naproxen, Ibuprofen, Motrin, Advil, Goody's, BC's, all herbal medications, fish oil, and all vitamins.  ERAS Protcol - yes, until 13:30 o'clock  COVID TEST- n/a  Anesthesia review: no  Patient verbally denies any shortness of breath, fever, cough and chest pain during phone call   -------------  SDW INSTRUCTIONS given:  Your procedure is scheduled on Friday, April 12th, 2024.   Report to [facility] Main Entrance "A" at 14:00 o'clock, the day of surgery, and check in at the Admitting office.  Call this number if you have problems the morning of surgery:  [phone]   Remember:  Do not eat after midnight the night before your surgery  You may drink clear liquids until 13:30 the day of your surgery.   Clear liquids allowed are: Water, Non-Citrus Juices (without pulp), Carbonated Beverages, Clear Tea, Black Coffee Only, and Gatorade    Take these medicines the morning of surgery with A SIP OF WATER:  Augmentin PRN - Hydrocodone   The day of surgery:                     Do not wear jewelry,             Do not wear lotions, powders, colognes, or deodorant.            Men may shave face and neck.            Do not bring valuables to the hospital.            [facility] is not responsible for any belongings or valuables.  Do NOT Smoke (Tobacco/Vaping) 24 hours prior to your procedure If you use a CPAP at night, you may bring all equipment for your overnight stay.   Contacts, glasses, dentures or bridgework may not be worn into surgery.      For patients admitted to the hospital, discharge time will be determined by your treatment team.   Patients discharged the day of surgery will not be allowed to drive  home, and someone needs to stay with them for 24 hours.    Special instructions:   [place]- Preparing For Surgery  Before surgery, you can play an important role. Because skin is not sterile, your skin needs to be as free of germs as possible. You can reduce the number of germs on your skin by washing with CHG (chlorahexidine gluconate) Soap before surgery.  CHG is an antiseptic cleaner which kills germs and bonds with the skin to continue killing germs even after washing.    Oral Hygiene is also important to reduce your risk of infection.  Remember - BRUSH YOUR TEETH THE MORNING OF SURGERY WITH YOUR REGULAR TOOTHPASTE  Please do not use if you have an allergy to CHG or antibacterial soaps. If your skin becomes reddened/irritated stop using the CHG.  Do not shave (including legs and underarms) for at least 48 hours prior to first CHG shower. It is OK to shave your face.  Please follow these instructions carefully.   Shower the NIGHT BEFORE SURGERY and the MORNING OF SURGERY with DIAL Soap.   Pat yourself dry with a CLEAN TOWEL.  Wear CLEAN PAJAMAS to bed the night before  surgery  Place CLEAN SHEETS on your bed the night of your first shower and DO NOT SLEEP WITH PETS.   Day of Surgery: Please shower morning of surgery  Wear Clean/Comfortable clothing the morning of surgery Do not apply any deodorants/lotions.   Remember to brush your teeth WITH YOUR REGULAR TOOTHPASTE.   Questions were answered. Patient verbalized understanding of instructions.

## 2022-07-24 ENCOUNTER — Encounter (HOSPITAL_COMMUNITY): Payer: Self-pay | Admitting: Otolaryngology

## 2022-07-24 ENCOUNTER — Other Ambulatory Visit: Payer: Self-pay

## 2022-07-24 ENCOUNTER — Ambulatory Visit (HOSPITAL_BASED_OUTPATIENT_CLINIC_OR_DEPARTMENT_OTHER): Payer: Managed Care, Other (non HMO) | Admitting: Certified Registered Nurse Anesthetist

## 2022-07-24 ENCOUNTER — Encounter (HOSPITAL_COMMUNITY)
Admission: RE | Disposition: A | Discharge status: Home / Self Care | Payer: Self-pay | Source: Home / Self Care | Attending: Otolaryngology

## 2022-07-24 ENCOUNTER — Ambulatory Visit (HOSPITAL_COMMUNITY): Payer: Managed Care, Other (non HMO) | Admitting: Certified Registered Nurse Anesthetist

## 2022-07-24 ENCOUNTER — Ambulatory Visit (HOSPITAL_COMMUNITY)
Admission: RE | Admit: 2022-07-24 | Discharge: 2022-07-24 | Disposition: A | Discharge status: Home / Self Care | Payer: Managed Care, Other (non HMO) | Attending: Otolaryngology | Admitting: Otolaryngology

## 2022-07-24 DIAGNOSIS — J342 Deviated nasal septum: Secondary | ICD-10-CM | POA: Diagnosis not present

## 2022-07-24 DIAGNOSIS — S022XXA Fracture of nasal bones, initial encounter for closed fracture: Secondary | ICD-10-CM | POA: Diagnosis present

## 2022-07-24 DIAGNOSIS — F17200 Nicotine dependence, unspecified, uncomplicated: Secondary | ICD-10-CM | POA: Diagnosis not present

## 2022-07-24 DIAGNOSIS — S022XXD Fracture of nasal bones, subsequent encounter for fracture with routine healing: Secondary | ICD-10-CM | POA: Diagnosis not present

## 2022-07-24 DIAGNOSIS — W500XXA Accidental hit or strike by another person, initial encounter: Secondary | ICD-10-CM | POA: Diagnosis not present

## 2022-07-24 DIAGNOSIS — G4730 Sleep apnea, unspecified: Secondary | ICD-10-CM | POA: Insufficient documentation

## 2022-07-24 HISTORY — PX: CLOSED REDUCTION NASAL FRACTURE: SHX5365

## 2022-07-24 HISTORY — DX: Personal history of urinary calculi: Z87.442

## 2022-07-24 SURGERY — CLOSED REDUCTION, FRACTURE, NASAL BONE
Anesthesia: General | Site: Nose

## 2022-07-24 MED ORDER — MIDAZOLAM HCL 2 MG/2ML IJ SOLN
INTRAMUSCULAR | Status: DC | PRN
  Administered 2022-07-24: 2 mg via INTRAVENOUS

## 2022-07-24 MED ORDER — FENTANYL CITRATE (PF) 250 MCG/5ML IJ SOLN
INTRAMUSCULAR | Status: DC | PRN
  Administered 2022-07-24: 25 ug via INTRAVENOUS
  Administered 2022-07-24: 50 ug via INTRAVENOUS

## 2022-07-24 MED ORDER — MEPERIDINE HCL 25 MG/ML IJ SOLN: 6.2500 mg | INTRAMUSCULAR | Status: DC | PRN

## 2022-07-24 MED ORDER — PROPOFOL 10 MG/ML IV BOLUS
INTRAVENOUS | Status: DC | PRN
  Administered 2022-07-24: 200 mg via INTRAVENOUS

## 2022-07-24 MED ORDER — LIDOCAINE 2% (20 MG/ML) 5 ML SYRINGE
INTRAMUSCULAR | Status: DC | PRN
  Administered 2022-07-24: 100 mg via INTRAVENOUS

## 2022-07-24 MED ORDER — PROMETHAZINE HCL 25 MG/ML IJ SOLN: 6.2500 mg | INTRAMUSCULAR | Status: DC | PRN

## 2022-07-24 MED ORDER — PROPOFOL 10 MG/ML IV BOLUS
INTRAVENOUS | Status: AC
  Filled 2022-07-24: qty 20

## 2022-07-24 MED ORDER — OXYCODONE HCL 5 MG PO TABS
ORAL_TABLET | ORAL | Status: AC
  Filled 2022-07-24: qty 1

## 2022-07-24 MED ORDER — OXYCODONE HCL 5 MG PO TABS
5.0000 mg | ORAL_TABLET | Freq: Once | ORAL | Status: AC | PRN
  Administered 2022-07-24: 5 mg via ORAL

## 2022-07-24 MED ORDER — LACTATED RINGERS IV SOLN: INTRAVENOUS | Status: DC

## 2022-07-24 MED ORDER — DEXAMETHASONE SODIUM PHOSPHATE 10 MG/ML IJ SOLN
INTRAMUSCULAR | Status: DC | PRN
  Administered 2022-07-24: 10 mg via INTRAVENOUS

## 2022-07-24 MED ORDER — MIDAZOLAM HCL 2 MG/2ML IJ SOLN
INTRAMUSCULAR | Status: AC
  Filled 2022-07-24: qty 2

## 2022-07-24 MED ORDER — OXYMETAZOLINE HCL 0.05 % NA SOLN
NASAL | Status: DC | PRN
  Administered 2022-07-24: 1 via TOPICAL

## 2022-07-24 MED ORDER — HYDROMORPHONE HCL 1 MG/ML IJ SOLN
INTRAMUSCULAR | Status: AC
  Filled 2022-07-24: qty 1

## 2022-07-24 MED ORDER — FENTANYL CITRATE (PF) 250 MCG/5ML IJ SOLN
INTRAMUSCULAR | Status: AC
  Filled 2022-07-24: qty 5

## 2022-07-24 MED ORDER — HYDROMORPHONE HCL 1 MG/ML IJ SOLN
0.2500 mg | INTRAMUSCULAR | Status: DC | PRN
  Administered 2022-07-24 (×2): 0.5 mg via INTRAVENOUS

## 2022-07-24 MED ORDER — AMISULPRIDE (ANTIEMETIC) 5 MG/2ML IV SOLN: 10.0000 mg | Freq: Once | INTRAVENOUS | Status: DC | PRN

## 2022-07-24 MED ORDER — CHLORHEXIDINE GLUCONATE 0.12 % MT SOLN: 15.0000 mL | Freq: Once | OROMUCOSAL | Status: AC

## 2022-07-24 MED ORDER — ORAL CARE MOUTH RINSE: 15.0000 mL | Freq: Once | OROMUCOSAL | Status: AC

## 2022-07-24 MED ORDER — ONDANSETRON HCL 4 MG/2ML IJ SOLN
INTRAMUSCULAR | Status: DC | PRN
  Administered 2022-07-24: 4 mg via INTRAVENOUS

## 2022-07-24 MED ORDER — CHLORHEXIDINE GLUCONATE 0.12 % MT SOLN
OROMUCOSAL | Status: AC
  Administered 2022-07-24: 15 mL via OROMUCOSAL
  Filled 2022-07-24: qty 15

## 2022-07-24 MED ORDER — OXYCODONE HCL 5 MG/5ML PO SOLN: 5.0000 mg | Freq: Once | ORAL | Status: AC | PRN

## 2022-07-24 MED ORDER — 0.9 % SODIUM CHLORIDE (POUR BTL) OPTIME
TOPICAL | Status: DC | PRN
  Administered 2022-07-24: 1000 mL

## 2022-07-24 MED ORDER — LIDOCAINE-EPINEPHRINE 1 %-1:100000 IJ SOLN
INTRAMUSCULAR | Status: AC
  Filled 2022-07-24: qty 1

## 2022-07-24 SURGICAL SUPPLY — 23 items
APL SKNCLS STERI-STRIP NONHPOA (GAUZE/BANDAGES/DRESSINGS) ×1
BAG COUNTER SPONGE SURGICOUNT (BAG) ×1 IMPLANT
BAG SPNG CNTER NS LX DISP (BAG) ×1
BENZOIN TINCTURE PRP APPL 2/3 (GAUZE/BANDAGES/DRESSINGS) ×1 IMPLANT
CANISTER SUCT 3000ML PPV (MISCELLANEOUS) ×1 IMPLANT
COVER BACK TABLE 60X90IN (DRAPES) ×1 IMPLANT
COVER MAYO STAND STRL (DRAPES) ×1 IMPLANT
DRAPE HALF SHEET 40X57 (DRAPES) IMPLANT
GAUZE 4X4 16PLY ~~LOC~~+RFID DBL (SPONGE) ×1 IMPLANT
GLOVE ECLIPSE 7.5 STRL STRAW (GLOVE) ×1 IMPLANT
KIT BASIN OR (CUSTOM PROCEDURE TRAY) ×1 IMPLANT
KIT SPLINT NASAL DENVER PET BE (GAUZE/BANDAGES/DRESSINGS) IMPLANT
KIT TURNOVER KIT B (KITS) ×1 IMPLANT
NDL HYPO 25GX1X1/2 BEV (NEEDLE) IMPLANT
NEEDLE HYPO 25GX1X1/2 BEV (NEEDLE) ×1 IMPLANT
NS IRRIG 1000ML POUR BTL (IV SOLUTION) IMPLANT
PAD ARMBOARD 7.5X6 YLW CONV (MISCELLANEOUS) ×2 IMPLANT
SPONGE NEURO XRAY DETECT 1X3 (DISPOSABLE) ×1 IMPLANT
STRIP CLOSURE SKIN 1/2X4 (GAUZE/BANDAGES/DRESSINGS) IMPLANT
SYR CONTROL 10ML LL (SYRINGE) IMPLANT
TOWEL GREEN STERILE FF (TOWEL DISPOSABLE) ×2 IMPLANT
TUBE CONNECTING 12X1/4 (SUCTIONS) ×1 IMPLANT
WATER STERILE IRR 1000ML POUR (IV SOLUTION) IMPLANT

## 2022-07-24 NOTE — Op Note (Signed)
DATE OF PROCEDURE: 07/24/2022  OPERATIVE REPORT    SURGEON:  Newman Pies, MD   PREOPERATIVE DIAGNOSIS:  Displaced nasal fractures   POSTOPERATIVE DIAGNOSIS:  Displaced nasal fractures   PROCEDURES PERFORMED:  Closed reduction of nasal fractures with stabilization   ANESTHESIA:  General laryngeal mask anesthesia.   COMPLICATIONS:  None.   ESTIMATED BLOOD LOSS:  Minimal.   INDICATION FOR PROCEDURE:   Jerry Singh is a 47 y.o. male who recently sustained a nasal trauma, resulting in displaced nasal fractures. On examination, the patient was noted to have deviated nasal dorsum. Based on the physical findings, the decision was made for the patient to undergo the above-stated procedure.  The risks, benefits, alternatives, and details of the procedures were discussed with the patient.  Questions were invited and answered.  Informed consent was obtained.   DESCRIPTION OF PROCEDURE:  The patient was taken to the operating room and placed supine on the operating table.  General laryngeal mask anesthesia was induced by the anesthesiologist.    Pledgets soaked with Afrin were placed in both nasal cavities for vasoconstriction. The pledgets were subsequently removed. Examination of the nose revealed a noticeable nasal dorsal deviation to the left. Using a nasal bone elevator, the depressed right nasal bone was elevated. Manual pressure was applied to the contralateral nasal bone to achieve reduction of the deviated nasal fractures.  Good reduction of the nasal fractures was achieved. A [place] splint was applied to the nasal dorsum. That concluded the procedure for the patient.    The care of the patient was turned over to the anesthesiologist.  The patient was awakened from anesthesia without difficulty.  He was extubated and transferred to the recovery room in good condition.   OPERATIVE FINDINGS: Nasal fractures with nasal dorsal deviation.   SPECIMEN:  None.   FOLLOWUP CARE:  The patient will be  discharged home once he is awake and alert.  He will follow up in my office in 1 week.

## 2022-07-24 NOTE — H&P (Signed)
Cc: Nasal fractures, nasal deformity  HPI: The patient is a 47 year old male who presents today for evaluation of his nasal fractures and nasal deformity.  According to the patient, he was playing basketball yesterday, when he was accidentally hit on the nose by an elbow.  It resulted in severe pain and epistaxis.  His nose was significantly deformed.  He was evaluated at the [facility] emergency room yesterday.  His CT scan showed bilateral displaced nasal bone fractures.  Currently he complains of nasal obstruction.  He has no previous ENT surgery.  The patient's review of systems (constitutional, eyes, ENT, cardiovascular, respiratory, GI, musculoskeletal, skin, neurologic, psychiatric, endocrine, hematologic, allergic) is noted in the ROS questionnaire.  It is reviewed with the patient.  Major events: Nasal fracture 07/20/2022, hernia repair.  Ongoing medical problems: None.  Family health history: No HTN, DM,CAD, hearing loss or bleeding disorder.  Social history: The patient is single. He smokes marihuana. He denies the use of alcohol or illegal drugs.    Exam: General: Communicates without difficulty, well nourished, no acute distress. Head: Normocephalic, no evidence injury, no tenderness, facial buttresses intact without stepoff. Face/sinus: No tenderness to palpation and percussion. Facial movement is normal and symmetric. Eyes: PERRL, EOMI. No scleral icterus, conjunctivae clear. Neuro: CN II exam reveals vision grossly intact.  No nystagmus at any point of gaze. Ears: Auricles well formed without lesions.  Ear canals are intact without mass or lesion.  No erythema or edema is appreciated.  The TMs are intact without fluid. Nose: External evaluation reveals significant dorsal deviation to the left.  Anterior rhinoscopy showed edematous nasal mucosa. Oral:  Oral cavity and oropharynx are intact, symmetric, without erythema or edema.  Mucosa is moist without lesions. Neck:  Full range of motion without pain.  There is no significant lymphadenopathy.  No masses palpable.  Thyroid bed within normal limits to palpation.  Parotid glands and submandibular glands equal bilaterally without mass.  Trachea is midline. Neuro:  CN 2-12 grossly intact. Gait normal.   Assessment 1.  Bilateral displaced nasal fractures. 2.  Nasal dorsal deformity with deviation to the left.  Plan  1.  The physical exam findings are reviewed with the patient. 2.  Due to the severe nasal deformity, he will benefit from surgical intervention with close reduction of the nasal fractures with stabilization.  The risk, benefits, and details of the procedure are reviewed with the patient. 3.  The patient would like to proceed with the procedure.  We will schedule the procedure at the [facility] on July 24, 2022.

## 2022-07-24 NOTE — Anesthesia Procedure Notes (Signed)
Procedure Name: LMA Insertion Date/Time: 07/24/2022 4:02 PM  Performed by: Lelon Perla, CRNAPre-anesthesia Checklist: Patient identified, Emergency Drugs available, Suction available and Patient being monitored Patient Re-evaluated:Patient Re-evaluated prior to induction Oxygen Delivery Method: Circle System Utilized Preoxygenation: Pre-oxygenation with 100% oxygen Induction Type: IV induction Ventilation: Mask ventilation without difficulty LMA: LMA inserted LMA Size: 4.0 Number of attempts: 1 Airway Equipment and Method: Bite block Placement Confirmation: positive ETCO2 Tube secured with: Tape Dental Injury: Teeth and Oropharynx as per pre-operative assessment

## 2022-07-24 NOTE — Transfer of Care (Signed)
Immediate Anesthesia Transfer of Care Note  Patient: Jerry Singh  Procedure(s) Performed: CLOSED REDUCTION NASAL FRACTURE (Nose)  Patient Location: PACU  Anesthesia Type:General  Level of Consciousness: drowsy  Airway & Oxygen Therapy: Patient Spontanous Breathing and Patient connected to face mask oxygen  Post-op Assessment: Report given to RN and Post -op Vital signs reviewed and stable  Post vital signs: Reviewed and stable  Last Vitals:  Vitals Value Taken Time  BP 155/104 07/24/22 1631  Temp    Pulse 67 07/24/22 1632  Resp 17 07/24/22 1632  SpO2 100 % 07/24/22 1632  Vitals shown include unvalidated device data.  Last Pain:  Vitals:   07/24/22 1426  TempSrc:   PainSc: 0-No pain         Complications: No notable events documented.

## 2022-07-24 NOTE — Discharge Instructions (Addendum)
The patient may resume all his previous activities and diet.  He will follow-up in my office in 1 week for splint removal.

## 2022-07-24 NOTE — Anesthesia Preprocedure Evaluation (Signed)
Anesthesia Evaluation  Patient identified by MRN, date of birth, ID band Patient awake    Reviewed: Allergy & Precautions, H&P , NPO status , Patient's Chart, lab work & pertinent test results  Airway Mallampati: II  TM Distance: >3 FB Neck ROM: Full    Dental no notable dental hx.    Pulmonary sleep apnea , Current Smoker and Patient abstained from smoking.   Pulmonary exam normal breath sounds clear to auscultation       Cardiovascular negative cardio ROS Normal cardiovascular exam Rhythm:Regular Rate:Normal     Neuro/Psych negative neurological ROS  negative psych ROS   GI/Hepatic Neg liver ROS,GERD  ,,  Endo/Other  negative endocrine ROS    Renal/GU negative Renal ROS  negative genitourinary   Musculoskeletal negative musculoskeletal ROS (+)    Abdominal   Peds negative pediatric ROS (+)  Hematology negative hematology ROS (+)   Anesthesia Other Findings   Reproductive/Obstetrics negative OB ROS                             Anesthesia Physical Anesthesia Plan  ASA: 2  Anesthesia Plan: General   Post-op Pain Management:    Induction: Intravenous  PONV Risk Score and Plan: 1 and Ondansetron and Treatment may vary due to age or medical condition  Airway Management Planned: LMA  Additional Equipment:   Intra-op Plan:   Post-operative Plan: Extubation in OR  Informed Consent: I have reviewed the patients History and Physical, chart, labs and discussed the procedure including the risks, benefits and alternatives for the proposed anesthesia with the patient or authorized representative who has indicated his/her understanding and acceptance.     Dental advisory given  Plan Discussed with: CRNA  Anesthesia Plan Comments:        Anesthesia Quick Evaluation

## 2022-07-25 ENCOUNTER — Encounter (HOSPITAL_COMMUNITY): Payer: Self-pay | Admitting: Otolaryngology

## 2022-07-25 NOTE — Anesthesia Postprocedure Evaluation (Signed)
Anesthesia Post Note  Patient: Jerry Singh  Procedure(s) Performed: CLOSED REDUCTION NASAL FRACTURE (Nose)     Patient location during evaluation: PACU Anesthesia Type: General Level of consciousness: awake and alert Pain management: pain level controlled Vital Signs Assessment: post-procedure vital signs reviewed and stable Respiratory status: spontaneous breathing, nonlabored ventilation and respiratory function stable Cardiovascular status: blood pressure returned to baseline and stable Postop Assessment: no apparent nausea or vomiting Anesthetic complications: no   No notable events documented.  Last Vitals:  Vitals:   07/24/22 1715 07/24/22 1730  BP: (!) 126/90 (!) 139/94  Pulse: 63 61  Resp: 10 15  Temp:  36.7 C  SpO2: 98% 100%    Last Pain:  Vitals:   07/24/22 1730  TempSrc:   PainSc: Asleep                 Lowella Curb

## 2022-10-23 ENCOUNTER — Telehealth (HOSPITAL_BASED_OUTPATIENT_CLINIC_OR_DEPARTMENT_OTHER): Payer: Self-pay | Admitting: *Deleted

## 2022-10-23 ENCOUNTER — Encounter (HOSPITAL_BASED_OUTPATIENT_CLINIC_OR_DEPARTMENT_OTHER): Payer: Self-pay | Admitting: *Deleted

## 2022-10-23 NOTE — Telephone Encounter (Signed)
LVM to offer colon cancer screening. Mychart message sent

## 2023-06-14 ENCOUNTER — Encounter (HOSPITAL_BASED_OUTPATIENT_CLINIC_OR_DEPARTMENT_OTHER): Payer: Self-pay | Admitting: *Deleted

## 2023-10-02 ENCOUNTER — Other Ambulatory Visit: Payer: Self-pay

## 2023-10-02 ENCOUNTER — Encounter (HOSPITAL_COMMUNITY): Payer: Self-pay

## 2023-10-02 ENCOUNTER — Emergency Department (HOSPITAL_COMMUNITY)
Admission: EM | Admit: 2023-10-02 | Discharge: 2023-10-03 | Disposition: A | Discharge status: Home / Self Care | Payer: Self-pay | Attending: Emergency Medicine | Admitting: Emergency Medicine

## 2023-10-02 DIAGNOSIS — M545 Low back pain, unspecified: Secondary | ICD-10-CM | POA: Diagnosis present

## 2023-10-02 DIAGNOSIS — X500XXA Overexertion from strenuous movement or load, initial encounter: Secondary | ICD-10-CM | POA: Diagnosis not present

## 2023-10-02 DIAGNOSIS — Y92007 Garden or yard of unspecified non-institutional (private) residence as the place of occurrence of the external cause: Secondary | ICD-10-CM | POA: Diagnosis not present

## 2023-10-02 DIAGNOSIS — M62830 Muscle spasm of back: Secondary | ICD-10-CM

## 2023-10-02 DIAGNOSIS — S39012A Strain of muscle, fascia and tendon of lower back, initial encounter: Secondary | ICD-10-CM | POA: Insufficient documentation

## 2023-10-02 NOTE — ED Triage Notes (Signed)
 Pt is coming in for lower back pain that has happened after he moved 10 bags of mulch this morning. The pain is in his lower back and he is having lower back spasms that is causing a significant amount of pain. He did try a muscle relaxant at home with no pain alleviation. Standing gives him the most pain relief but he can feel the pain in his legs when the spasms do occur. No other complaints at this time.

## 2023-10-03 MED ORDER — CYCLOBENZAPRINE HCL 10 MG PO TABS: 5.0000 mg | ORAL_TABLET | Freq: Every day | ORAL | 0 refills | Status: AC

## 2023-10-03 MED ORDER — KETOROLAC TROMETHAMINE 60 MG/2ML IM SOLN
30.0000 mg | Freq: Once | INTRAMUSCULAR | Status: AC
  Administered 2023-10-03: 30 mg via INTRAMUSCULAR
  Filled 2023-10-03: qty 2
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
## 2023-10-03 NOTE — Discharge Instructions (Signed)

## 2023-12-14 IMAGING — DX DG KNEE COMPLETE 4+V*L*
4 series · 4 of 4 positions shown · non-contrast
Comparison: Knee XRs, 09/09/2008.

CLINICAL DATA: Chronic bilateral knee pain

EXAM:
LEFT KNEE - COMPLETE 4+ VIEW; RIGHT KNEE - COMPLETE 4+ VIEW

[knee tunnel]
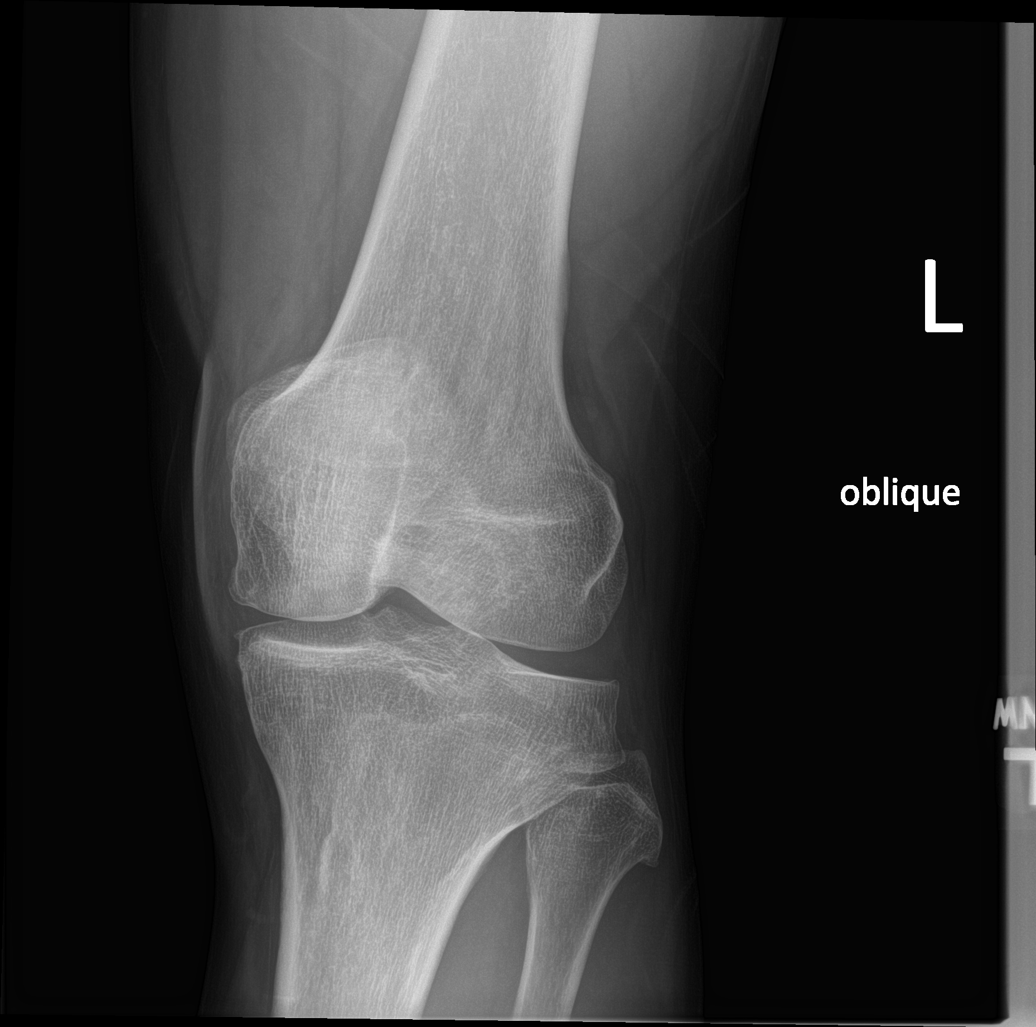

[patella sunrise]
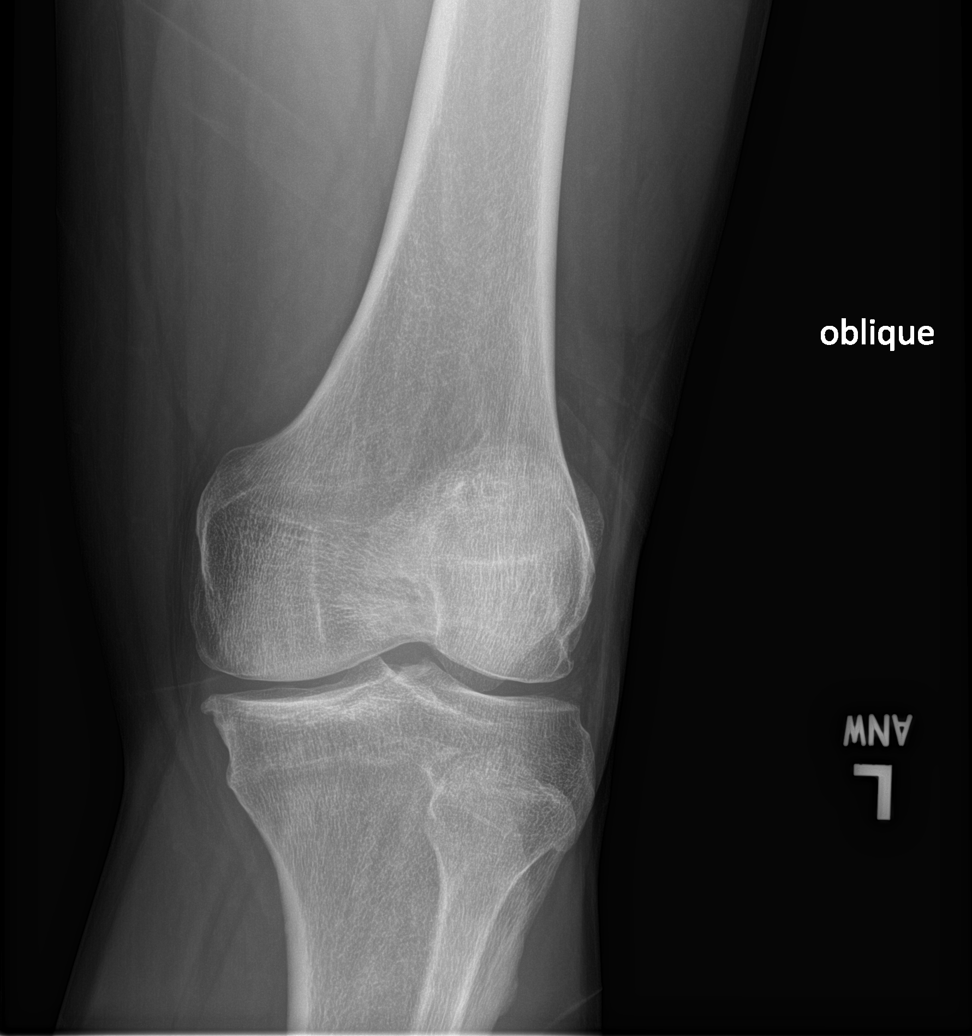

[knee lat]
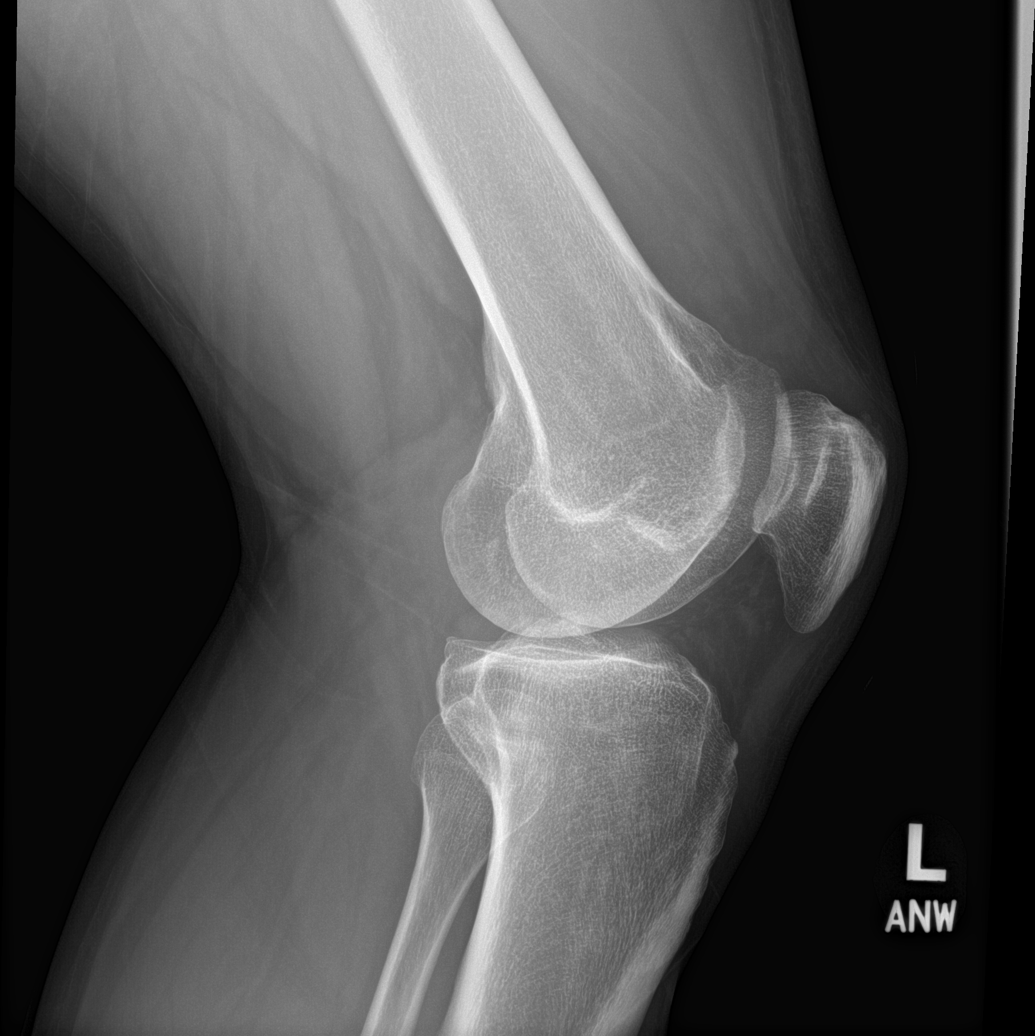

[knee ap]
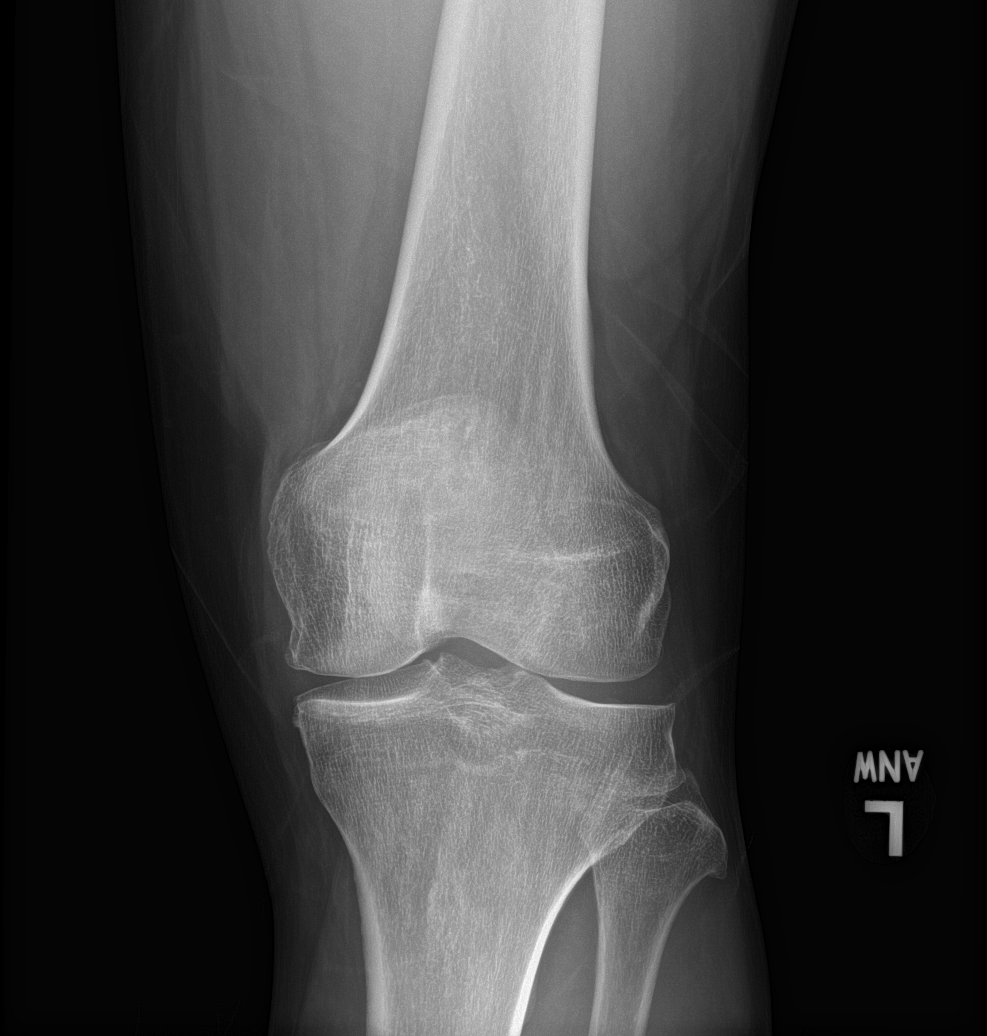

[4 of 4 positions shown; findings below may reference images not displayed]

FINDINGS: No acute fracture, dislocation, or joint effusion.

Mild RIGHT and moderate LEFT knee osteoarthrosis, greatest at the
medial compartments. Joint space narrowing with subchondral
sclerosis at the LEFT medial tibial plateau and osteophyte
formation.

Soft tissues are unremarkable.
IMPRESSION: Bilateral knee osteoarthrosis, mild RIGHT and moderate LEFT, and
greatest within the medial compartments as described above.

## 2024-03-31 ENCOUNTER — Emergency Department (HOSPITAL_COMMUNITY): Payer: Self-pay

## 2024-03-31 ENCOUNTER — Emergency Department (HOSPITAL_COMMUNITY)
Admission: EM | Admit: 2024-03-31 | Discharge: 2024-03-31 | Disposition: A | Discharge status: Home / Self Care | Payer: Self-pay | Attending: Emergency Medicine | Admitting: Emergency Medicine

## 2024-03-31 ENCOUNTER — Encounter (HOSPITAL_COMMUNITY): Payer: Self-pay | Admitting: Emergency Medicine

## 2024-03-31 DIAGNOSIS — R079 Chest pain, unspecified: Secondary | ICD-10-CM | POA: Insufficient documentation

## 2024-03-31 DIAGNOSIS — R9431 Abnormal electrocardiogram [ECG] [EKG]: Secondary | ICD-10-CM | POA: Insufficient documentation

## 2024-03-31 DIAGNOSIS — Z720 Tobacco use: Secondary | ICD-10-CM | POA: Insufficient documentation

## 2024-03-31 LAB — BASIC METABOLIC PANEL WITH GFR
Anion gap: 14 (ref 5–15)
BUN: 12 mg/dL (ref 6–20)
CO2: 21 mmol/L — ABNORMAL LOW (ref 22–32)
Calcium: 9.5 mg/dL (ref 8.9–10.3)
Chloride: 104 mmol/L (ref 98–111)
Creatinine, Ser: 1.14 mg/dL (ref 0.61–1.24)
GFR, Estimated: >60 mL/min
Glucose, Bld: 103 mg/dL — ABNORMAL HIGH (ref 70–99)
Potassium: 3.9 mmol/L (ref 3.5–5.1)
Sodium: 139 mmol/L (ref 135–145)

## 2024-03-31 LAB — CBC
HCT: 42.6 % (ref 39.0–52.0)
Hemoglobin: 14.3 g/dL (ref 13.0–17.0)
MCH: 29.5 pg (ref 26.0–34.0)
MCHC: 33.6 g/dL (ref 30.0–36.0)
MCV: 88 fL (ref 80.0–100.0)
Platelets: 231 K/uL (ref 150–400)
RBC: 4.84 MIL/uL (ref 4.22–5.81)
RDW: 14.3 % (ref 11.5–15.5)
WBC: 12.7 K/uL — ABNORMAL HIGH (ref 4.0–10.5)
nRBC: 0 % (ref 0.0–0.2)

## 2024-03-31 LAB — TROPONIN T, HIGH SENSITIVITY
Troponin T High Sensitivity: <15 ng/L (ref 0–19)
Troponin T High Sensitivity: <15 ng/L (ref 0–19)

## 2024-03-31 MED ORDER — NITROGLYCERIN 0.4 MG SL SUBL: 0.4000 mg | SUBLINGUAL_TABLET | SUBLINGUAL | Status: DC | PRN

## 2024-03-31 MED ORDER — ASPIRIN 81 MG PO CHEW
324.0000 mg | CHEWABLE_TABLET | Freq: Once | ORAL | Status: AC
  Administered 2024-03-31: 324 mg via ORAL
  Filled 2024-03-31: qty 4
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
## 2024-03-31 NOTE — ED Provider Notes (Signed)
 " [place] [facility] Provider Note   CSN: [id number] Arrival date & time: 03/31/24  9198     Patient presents with: Chest Pain   Jerry Singh is a 48 y.o. male.   Pt is a 48 yo male with pmhx significant for tobacco abuse.  Pt was having an argument this am and then developed CP.  He said it felt like a pressure, but now it is just a pain.  No sob.  He had associated nausea.         Prior to Admission medications  Medication Sig Start Date End Date Taking? Authorizing Provider  acetaminophen  (TYLENOL ) 325 MG tablet Take 2 tablets (650 mg total) by mouth every 6 (six) hours. Patient taking differently: Take 650 mg by mouth every 6 (six) hours as needed for headache. 12/22/21   Elgergawy, Brayton RAMAN, MD  amoxicillin -clavulanate (AUGMENTIN ) 875-125 MG tablet Take 1 tablet by mouth every 12 (twelve) hours. 07/20/22   Small, Brooke L, PA  ciprofloxacin -dexamethasone  (CIPRODEX ) OTIC suspension Place 4 drops into the left ear 2 (two) times daily. Patient not taking: Reported on 07/21/2022 12/22/21   Elgergawy, Brayton RAMAN, MD  HYDROcodone -acetaminophen  (NORCO) 5-325 MG tablet Take 1 tablet by mouth every 6 (six) hours as needed for moderate pain. 07/20/22   Small, Brooke L, PA  ibuprofen  (ADVIL ) 400 MG tablet Take 1 tablet (400 mg total) by mouth every 8 (eight) hours as needed for moderate pain. Patient not taking: Reported on 07/21/2022 12/22/21   Elgergawy, Brayton RAMAN, MD  levETIRAcetam  (KEPPRA ) 500 MG tablet Take 1 tablet (500 mg total) by mouth 2 (two) times daily. Patient not taking: Reported on 07/21/2022 12/22/21   Elgergawy, Brayton RAMAN, MD  loratadine  (CLARITIN ) 10 MG tablet Take 1 tablet (10 mg total) by mouth daily for 14 days. Patient not taking: Reported on 07/21/2022 12/22/21 01/05/22  Elgergawy, Brayton RAMAN, MD  pantoprazole  (PROTONIX ) 40 MG tablet Take 1 tablet (40 mg total) by mouth daily. Patient not taking: Reported on 01/29/2022 12/23/21   Elgergawy, Brayton RAMAN, MD    Allergies: Food    Review of Systems  Cardiovascular:  Positive for chest pain.  Gastrointestinal:  Positive for nausea.  All other systems reviewed and are negative.   Updated Vital Signs BP (!) 149/98 (BP Location: Right Arm)   Pulse (!) 104   Temp 98.2 F (36.8 C)   Resp 20   Ht 5' 11 (1.803 m)   Wt 86.2 kg   SpO2 97%   BMI 26.50 kg/m   Physical Exam Vitals and nursing note reviewed.  Constitutional:      Appearance: He is well-developed.  HENT:     Head: Normocephalic and atraumatic.  Eyes:     Extraocular Movements: Extraocular movements intact.     Pupils: Pupils are equal, round, and reactive to light.  Cardiovascular:     Rate and Rhythm: Normal rate and regular rhythm.     Heart sounds: Normal heart sounds.  Pulmonary:     Effort: Pulmonary effort is normal.     Breath sounds: Normal breath sounds.  Abdominal:     General: Bowel sounds are normal.     Palpations: Abdomen is soft.  Musculoskeletal:        General: Normal range of motion.     Cervical back: Normal range of motion and neck supple.  Skin:    General: Skin is warm.     Capillary Refill: Capillary refill takes less  than 2 seconds.  Neurological:     General: No focal deficit present.     Mental Status: He is alert and oriented to person, place, and time.  Psychiatric:        Mood and Affect: Mood normal.        Behavior: Behavior normal.     (all labs ordered are listed, but only abnormal results are displayed) Labs Reviewed  BASIC METABOLIC PANEL WITH GFR - Abnormal; Notable for the following components:      Result Value   CO2 21 (*)    Glucose, Bld 103 (*)    All other components within normal limits  CBC - Abnormal; Notable for the following components:   WBC 12.7 (*)    All other components within normal limits  TROPONIN T, HIGH SENSITIVITY  TROPONIN T, HIGH SENSITIVITY    EKG: EKG Interpretation Date/Time:  Friday March 31 2024 08:10:07 EST Ventricular  Rate:  86 PR Interval:  150 QRS Duration:  80 QT Interval:  358 QTC Calculation: 428 R Axis:   93  Text Interpretation: Normal sinus rhythm Right atrial enlargement Rightward axis Pulmonary disease pattern ST elevation, consider early repolarization, pericarditis, or injury ST & T wave abnormality, consider inferior ischemia Abnormal ECG No previous ECGs available Confirmed by Dean Clarity [phone]) on 03/31/2024 9:21:19 AM  Radiology: [facility] Chest 2 View Result Date: 03/31/2024 EXAM: 2 VIEW(S) XRAY OF THE CHEST 03/31/2024 08:39:00 AM COMPARISON: Radiograph of the chest dated 12/20/2021. CLINICAL HISTORY: cp FINDINGS: LUNGS AND PLEURA: No focal pulmonary opacity. No pleural effusion. No pneumothorax. HEART AND MEDIASTINUM: No acute abnormality of the cardiac and mediastinal silhouettes. BONES AND SOFT TISSUES: No acute osseous abnormality. IMPRESSION: 1. No acute cardiopulmonary abnormality. Electronically signed by: Evalene Coho MD 03/31/2024 09:19 AM EST RP Workstation: [id number]     Procedures   Medications Ordered in the ED  nitroGLYCERIN (NITROSTAT) SL tablet 0.4 mg (has no administration in time range)  aspirin chewable tablet 324 mg (324 mg Oral Given 03/31/24 0934)                                    Medical Decision Making Amount and/or Complexity of Data Reviewed Labs: ordered. Radiology: ordered.  Risk OTC drugs. Prescription drug management.   This patient presents to the ED for concern of cp, this involves an extensive number of treatment options, and is a complaint that carries with it a high risk of complications and morbidity.  The differential diagnosis includes cardiac, pulm, gi, msk   Co morbidities that complicate the patient evaluation  Tobacco abuse   Additional history obtained:  Additional history obtained from epic chart review External records from outside source obtained and reviewed including family   Lab Tests:  I Ordered, and  personally interpreted labs.  The pertinent results include:  cbc with wbc elevated at 12.7; bmp nl; trop  nl   Imaging Studies ordered:  I ordered imaging studies including cxr  I independently visualized and interpreted imaging which showed No acute cardiopulmonary abnormality.  I agree with the radiologist interpretation   Cardiac Monitoring:  The patient was maintained on a cardiac monitor.  I personally viewed and interpreted the cardiac monitored which showed an underlying rhythm of: nsr   Medicines ordered and prescription drug management:  I ordered medication including asa  for sx  Reevaluation of the patient after these medicines showed that the patient  improved I have reviewed the patients home medicines and have made adjustments as needed   Problem List / ED Course:  CP:  cardiac work up neg.  No sob or hypoxia, so doubt PE.  This occurred after an argument.  EKG abn, but no STEMI.  Pt referred to cards for further work up of cp and EKG.   Reevaluation:  After the interventions noted above, I reevaluated the patient and found that they have :improved   Social Determinants of Health:  Lives at home   Dispostion:  After consideration of the diagnostic results and the patients response to treatment, I feel that the patent would benefit from discharge with outpatient f/u.       Final diagnoses:  Abnormal EKG  Nonspecific chest pain    ED Discharge Orders          Ordered    Ambulatory referral to Cardiology        03/31/24 1045               Dean Clarity, MD 03/31/24 1046  "
